# Patient Record
Sex: Male | Born: 1975 | Hispanic: Yes | Marital: Married | Smoking: Never smoker
Health system: Southern US, Community
[De-identification: ages and names within clinical notes are randomized; demographics above are authoritative.]

## PROBLEM LIST (undated history)

## (undated) HISTORY — PX: OTHER SURGICAL HISTORY: SHX169

---

## 2011-01-20 ENCOUNTER — Emergency Department (INDEPENDENT_AMBULATORY_CARE_PROVIDER_SITE_OTHER)
Admission: EM | Admit: 2011-01-20 | Discharge: 2011-01-20 | Disposition: A | Discharge status: Home / Self Care | Payer: Self-pay | Source: Home / Self Care | Attending: Family Medicine | Admitting: Family Medicine

## 2011-01-20 ENCOUNTER — Encounter (HOSPITAL_COMMUNITY): Payer: Self-pay | Admitting: *Deleted

## 2011-01-20 DIAGNOSIS — M545 Low back pain: Secondary | ICD-10-CM

## 2011-01-20 DIAGNOSIS — Z041 Encounter for examination and observation following transport accident: Secondary | ICD-10-CM

## 2011-01-20 MED ORDER — IBUPROFEN 800 MG PO TABS: 800.0000 mg | ORAL_TABLET | Freq: Three times a day (TID) | ORAL | Status: AC

## 2011-01-20 MED ORDER — GUAIFENESIN-CODEINE 100-10 MG/5ML PO SYRP: 10.0000 mL | ORAL_SOLUTION | Freq: Three times a day (TID) | ORAL | Status: DC | PRN

## 2011-01-20 MED ORDER — METHOCARBAMOL 500 MG PO TABS: 500.0000 mg | ORAL_TABLET | Freq: Three times a day (TID) | ORAL | Status: AC

## 2011-01-20 MED ORDER — IPRATROPIUM BROMIDE 0.06 % NA SOLN: 2.0000 | Freq: Four times a day (QID) | NASAL | Status: DC

## 2011-01-20 NOTE — ED Provider Notes (Deleted)
History     CSN: [id number]  Arrival date & time 01/20/11  1545   First MD Initiated Contact with Patient 01/20/11 1617      No chief complaint on file.   (Consider location/radiation/quality/duration/timing/severity/associated sxs/prior treatment) Patient is a 36 y.o. male presenting with URI. The history is provided by the patient.  URI The primary symptoms include cough. Primary symptoms do not include fever, nausea, vomiting, myalgias, arthralgias or rash. The current episode started 3 to 5 days ago. This is a recurrent problem. The problem has been gradually improving.  The onset of the illness is associated with exposure to sick contacts. Symptoms associated with the illness include congestion and rhinorrhea.    No past medical history on file.  No past surgical history on file.  No family history on file.  History  Substance Use Topics  . Smoking status: Not on file  . Smokeless tobacco: Not on file  . Alcohol Use: Not on file      Review of Systems  Constitutional: Negative.  Negative for fever.  HENT: Positive for congestion and rhinorrhea.   Eyes: Positive for redness.  Respiratory: Positive for cough.   Gastrointestinal: Negative.  Negative for nausea and vomiting.  Genitourinary: Negative.   Musculoskeletal: Negative for myalgias and arthralgias.  Skin: Negative for rash.    Allergies  Review of patient's allergies indicates not on file.  Home Medications  No current outpatient prescriptions on file.  There were no vitals taken for this visit.  Physical Exam  Nursing note and vitals reviewed. Constitutional: He is oriented to person, place, and time. He appears well-developed and well-nourished.  HENT:  Head: Normocephalic.  Right Ear: External ear normal.  Left Ear: External ear normal.  Nose: Mucosal edema and rhinorrhea present.  Mouth/Throat: Oropharynx is clear and moist.  Eyes: EOM are normal. Pupils are equal, round, and reactive to  light. Right conjunctiva is injected. Left conjunctiva is injected.  Neck: Normal range of motion. Neck supple.  Cardiovascular: Normal rate, normal heart sounds and intact distal pulses.   Pulmonary/Chest: Effort normal and breath sounds normal. He has no wheezes. He has no rales.  Lymphadenopathy:    He has no cervical adenopathy.  Neurological: He is alert and oriented to person, place, and time.  Skin: Skin is warm and dry.  Psychiatric: He has a normal mood and affect.    ED Course  Procedures (including critical care time)  Labs Reviewed - No data to display No results found.   No diagnosis found.    MDM          Barkley Bruns, MD 01/20/11 [phone]

## 2011-01-20 NOTE — Discharge Instructions (Signed)
Use medicine as needed and heat and stretch back daily.
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
## 2013-12-31 ENCOUNTER — Emergency Department (HOSPITAL_COMMUNITY)
Admission: EM | Admit: 2013-12-31 | Discharge: 2013-12-31 | Disposition: A | Discharge status: Home / Self Care | Payer: 59 | Attending: Emergency Medicine | Admitting: Emergency Medicine

## 2013-12-31 ENCOUNTER — Encounter (HOSPITAL_COMMUNITY): Payer: Self-pay | Admitting: Emergency Medicine

## 2013-12-31 DIAGNOSIS — R42 Dizziness and giddiness: Secondary | ICD-10-CM | POA: Diagnosis not present

## 2013-12-31 DIAGNOSIS — R51 Headache: Secondary | ICD-10-CM | POA: Insufficient documentation

## 2013-12-31 DIAGNOSIS — I10 Essential (primary) hypertension: Secondary | ICD-10-CM | POA: Diagnosis present

## 2013-12-31 LAB — CBC WITH DIFFERENTIAL/PLATELET
BASOS ABS: 0 10*3/uL (ref 0.0–0.1)
BASOS PCT: 0 % (ref 0–1)
Eosinophils Absolute: 0.2 10*3/uL (ref 0.0–0.7)
Eosinophils Relative: 2 % (ref 0–5)
HEMATOCRIT: 44.4 % (ref 39.0–52.0)
Hemoglobin: 14.9 g/dL (ref 13.0–17.0)
LYMPHS PCT: 34 % (ref 12–46)
Lymphs Abs: 2.3 10*3/uL (ref 0.7–4.0)
MCH: 30.6 pg (ref 26.0–34.0)
MCHC: 33.6 g/dL (ref 30.0–36.0)
MCV: 91.2 fL (ref 78.0–100.0)
MONO ABS: 0.6 10*3/uL (ref 0.1–1.0)
Monocytes Relative: 9 % (ref 3–12)
NEUTROS ABS: 3.7 10*3/uL (ref 1.7–7.7)
NEUTROS PCT: 55 % (ref 43–77)
PLATELETS: 274 10*3/uL (ref 150–400)
RBC: 4.87 MIL/uL (ref 4.22–5.81)
RDW: 12.4 % (ref 11.5–15.5)
WBC: 6.9 10*3/uL (ref 4.0–10.5)

## 2013-12-31 LAB — COMPREHENSIVE METABOLIC PANEL
ALBUMIN: 4.3 g/dL (ref 3.5–5.2)
ALT: 26 U/L (ref 0–53)
AST: 25 U/L (ref 0–37)
Alkaline Phosphatase: 66 U/L (ref 39–117)
Anion gap: 12 (ref 5–15)
BILIRUBIN TOTAL: 0.4 mg/dL (ref 0.3–1.2)
BUN: 15 mg/dL (ref 6–23)
CHLORIDE: 104 meq/L (ref 96–112)
CO2: 23 mmol/L (ref 19–32)
Calcium: 9.6 mg/dL (ref 8.4–10.5)
Creatinine, Ser: 0.92 mg/dL (ref 0.50–1.35)
GFR calc Af Amer: >90 mL/min (ref >90)
Glucose, Bld: 93 mg/dL (ref 70–99)
POTASSIUM: 3.9 mmol/L (ref 3.5–5.1)
SODIUM: 139 mmol/L (ref 135–145)
Total Protein: 7 g/dL (ref 6.0–8.3)

## 2013-12-31 MED ORDER — LISINOPRIL 5 MG PO TABS: 20.0000 mg | ORAL_TABLET | Freq: Every day | ORAL | Status: DC

## 2013-12-31 NOTE — ED Notes (Signed)
Pt a/o x 4 on d/c with steady gait. 

## 2013-12-31 NOTE — ED Notes (Signed)
Pt c/o htn and some HA and dizziness x 1 week

## 2013-12-31 NOTE — ED Provider Notes (Signed)
CSN: [id number]     Arrival date & time 12/31/13  1621 History   First MD Initiated Contact with Patient 12/31/13 1939     Chief Complaint  Patient presents with  . Hypertension     (Consider location/radiation/quality/duration/timing/severity/associated sxs/prior Treatment) HPI Comments: Patient presents to the ER for evaluation of headache, mild dizziness. Patient reports symptoms have been ongoing for a week. He went to [facility] today and checked his blood pressure which was elevated. He does not have a history of hypertension. He has not had any chest pain or shortness of breath. There is no blurred vision. He does report that he has been under a lot of stress at home.  Patient is a 38 y.o. male presenting with hypertension.  Hypertension Associated symptoms include headaches.    History reviewed. No pertinent past medical history. History reviewed. No pertinent past surgical history. History reviewed. No pertinent family history. History  Substance Use Topics  . Smoking status: Never Smoker   . Smokeless tobacco: Not on file  . Alcohol Use: Yes    Review of Systems  Neurological: Positive for dizziness and headaches.  All other systems reviewed and are negative.     Allergies  Review of patient's allergies indicates no known allergies.  Home Medications   Prior to Admission medications   Medication Sig Start Date End Date Taking? Authorizing Provider  ibuprofen (ADVIL,MOTRIN) 400 MG tablet Take 400 mg by mouth every 6 (six) hours as needed for mild pain.    Yes Historical Provider, MD   BP 148/84 mmHg  Pulse 73  Temp(Src) 97.9 F (36.6 C) (Oral)  Resp 19  SpO2 100% Physical Exam  Constitutional: He is oriented to person, place, and time. He appears well-developed and well-nourished. No distress.  HENT:  Head: Normocephalic and atraumatic.  Right Ear: Hearing normal.  Left Ear: Hearing normal.  Nose: Nose normal.  Mouth/Throat: Oropharynx is clear and  moist and mucous membranes are normal.  Eyes: Conjunctivae and EOM are normal. Pupils are equal, round, and reactive to light.  Neck: Normal range of motion. Neck supple.  Cardiovascular: Regular rhythm, S1 normal and S2 normal.  Exam reveals no gallop and no friction rub.   No murmur heard. Pulmonary/Chest: Effort normal and breath sounds normal. No respiratory distress. He exhibits no tenderness.  Abdominal: Soft. Normal appearance and bowel sounds are normal. There is no hepatosplenomegaly. There is no tenderness. There is no rebound, no guarding, no tenderness at McBurney's point and negative Murphy's sign. No hernia.  Musculoskeletal: Normal range of motion.  Neurological: He is alert and oriented to person, place, and time. He has normal strength. No cranial nerve deficit or sensory deficit. Coordination normal. GCS eye subscore is 4. GCS verbal subscore is 5. GCS motor subscore is 6.  Extraocular muscle movement: normal No visual field cut Pupils: equal and reactive both direct and consensual response is normal No nystagmus present    Sensory function is intact to light touch, pinprick Proprioception intact  Grip strength 5/5 symmetric in upper extremities Lower extremity strength 5/5 against gravity No pronator drift Normal finger to nose bilaterally Normal heel to shin bilaterally  Gait: normal    Skin: Skin is warm, dry and intact. No rash noted. No cyanosis.  Psychiatric: He has a normal mood and affect. His speech is normal and behavior is normal. Thought content normal.  Nursing note and vitals reviewed.   ED Course  Procedures (including critical care time) Labs Review Labs Reviewed  CBC WITH DIFFERENTIAL  COMPREHENSIVE METABOLIC PANEL    Imaging Review No results found.   EKG Interpretation None      MDM   Final diagnoses:  None  Hypertention  Patient presents early for evaluation of elevated blood pressure. He checked his blood pressure prior to  coming in was elevated. He does not have a previous history. Patient has been experiencing mild headaches and dizziness this week. He does not, however, have any focal findings on his neurologic exam. Headache is not concerning for subarachnoid hemorrhage. He is afebrile, no signs of meningismus.  Patient does have mildly elevated blood pressure. The ER. I did have a conversation with he and his significant other. He does not have a primary doctor and does not like to see doctors. Significant other is concerned that he might not follow up, and therefore patient will be initiated on lisinopril. He was given resources for primary care. Return if symptoms worsen.  Orpah Greek, MD 12/31/13 2011

## 2013-12-31 NOTE — Discharge Instructions (Signed)
Hypertension °Hypertension, commonly called high blood pressure, is when the force of blood pumping through your arteries is too strong. Your arteries are the blood vessels that carry blood from your heart throughout your body. A blood pressure reading consists of a higher number over a lower number, such as 110/72. The higher number (systolic) is the pressure inside your arteries when your heart pumps. The lower number (diastolic) is the pressure inside your arteries when your heart relaxes. Ideally you want your blood pressure below 120/80. °Hypertension forces your heart to work harder to pump blood. Your arteries may become narrow or stiff. Having hypertension puts you at risk for heart disease, stroke, and other problems.  °RISK FACTORS °Some risk factors for high blood pressure are controllable. Others are not.  °Risk factors you cannot control include:  °· Race. You may be at higher risk if you are African American. °· Age. Risk increases with age. °· Gender. Men are at higher risk than women before age 45 years. After age 65, women are at higher risk than men. °Risk factors you can control include: °· Not getting enough exercise or physical activity. °· Being overweight. °· Getting too much fat, sugar, calories, or salt in your diet. °· Drinking too much alcohol. °SIGNS AND SYMPTOMS °Hypertension does not usually cause signs or symptoms. Extremely high blood pressure (hypertensive crisis) may cause headache, anxiety, shortness of breath, and nosebleed. °DIAGNOSIS  °To check if you have hypertension, your health care provider will measure your blood pressure while you are seated, with your arm held at the level of your heart. It should be measured at least twice using the same arm. Certain conditions can cause a difference in blood pressure between your right and left arms. A blood pressure reading that is higher than normal on one occasion does not mean that you need treatment. If one blood pressure reading  is high, ask your health care provider about having it checked again. °TREATMENT  °Treating high blood pressure includes making lifestyle changes and possibly taking medicine. Living a healthy lifestyle can help lower high blood pressure. You may need to change some of your habits. °Lifestyle changes may include: °· Following the DASH diet. This diet is high in fruits, vegetables, and whole grains. It is low in salt, red meat, and added sugars. °· Getting at least 2½ hours of brisk physical activity every week. °· Losing weight if necessary. °· Not smoking. °· Limiting alcoholic beverages. °· Learning ways to reduce stress. ° If lifestyle changes are not enough to get your blood pressure under control, your health care provider may prescribe medicine. You may need to take more than one. Work closely with your health care provider to understand the risks and benefits. °HOME CARE INSTRUCTIONS °· Have your blood pressure rechecked as directed by your health care provider.   °· Take medicines only as directed by your health care provider. Follow the directions carefully. Blood pressure medicines must be taken as prescribed. The medicine does not work as well when you skip doses. Skipping doses also puts you at risk for problems.   °· Do not smoke.   °· Monitor your blood pressure at home as directed by your health care provider.  °SEEK MEDICAL CARE IF:  °· You think you are having a reaction to medicines taken. °· You have recurrent headaches or feel dizzy. °· You have swelling in your ankles. °· You have trouble with your vision. °SEEK IMMEDIATE MEDICAL CARE IF: °· You develop a severe headache or confusion. °·   You have unusual weakness, numbness, or feel faint. °· You have severe chest or abdominal pain. °· You vomit repeatedly. °· You have trouble breathing. °MAKE SURE YOU:  °· Understand these instructions. °· Will watch your condition. °· Will get help right away if you are not doing well or get worse. °Document  Released: 12/21/2004 Document Revised: 05/07/2013 Document Reviewed: 10/13/2012 °ExitCare® Patient Information ©2015 [employer]. This information is not intended to replace advice given to you by your health care provider. Make sure you discuss any questions you have with your health care provider. ° °DASH Eating Plan °DASH stands for "Dietary Approaches to Stop Hypertension." The DASH eating plan is a healthy eating plan that has been shown to reduce high blood pressure (hypertension). Additional health benefits may include reducing the risk of type 2 diabetes mellitus, heart disease, and stroke. The DASH eating plan may also help with weight loss. °WHAT DO I NEED TO KNOW ABOUT THE DASH EATING PLAN? °For the DASH eating plan, you will follow these general guidelines: °· Choose foods with a percent daily value for sodium of less than 5% (as listed on the food label). °· Use salt-free seasonings or herbs instead of table salt or sea salt. °· Check with your health care provider or pharmacist before using salt substitutes. °· Eat lower-sodium products, often labeled as "lower sodium" or "no salt added." °· Eat fresh foods. °· Eat more vegetables, fruits, and low-fat dairy products. °· Choose whole grains. Look for the word "whole" as the first word in the ingredient list. °· Choose fish and skinless chicken or turkey more often than red meat. Limit fish, poultry, and meat to 6 oz (170 g) each day. °· Limit sweets, desserts, sugars, and sugary drinks. °· Choose heart-healthy fats. °· Limit cheese to 1 oz (28 g) per day. °· Eat more home-cooked food and less restaurant, buffet, and fast food. °· Limit fried foods. °· Cook foods using methods other than frying. °· Limit canned vegetables. If you do use them, rinse them well to decrease the sodium. °· When eating at a restaurant, ask that your food be prepared with less salt, or no salt if possible. °WHAT FOODS CAN I EAT? °Seek help from a dietitian for individual  calorie needs. °Grains °Whole grain or whole wheat bread. Brown rice. Whole grain or whole wheat pasta. Quinoa, bulgur, and whole grain cereals. Low-sodium cereals. Corn or whole wheat flour tortillas. Whole grain cornbread. Whole grain crackers. Low-sodium crackers. °Vegetables °Fresh or frozen vegetables (raw, steamed, roasted, or grilled). Low-sodium or reduced-sodium tomato and vegetable juices. Low-sodium or reduced-sodium tomato sauce and paste. Low-sodium or reduced-sodium canned vegetables.  °Fruits °All fresh, canned (in natural juice), or frozen fruits. °Meat and Other Protein Products °Ground beef (85% or leaner), grass-fed beef, or beef trimmed of fat. Skinless chicken or turkey. Ground chicken or turkey. Pork trimmed of fat. All fish and seafood. Eggs. Dried beans, peas, or lentils. Unsalted nuts and seeds. Unsalted canned beans. °Dairy °Low-fat dairy products, such as skim or 1% milk, 2% or reduced-fat cheeses, low-fat ricotta or cottage cheese, or plain low-fat yogurt. Low-sodium or reduced-sodium cheeses. °Fats and Oils °Tub margarines without trans fats. Light or reduced-fat mayonnaise and salad dressings (reduced sodium). Avocado. Safflower, olive, or canola oils. Natural peanut or almond butter. °Other °Unsalted popcorn and pretzels. °The items listed above may not be a complete list of recommended foods or beverages. Contact your dietitian for more options. °WHAT FOODS ARE NOT RECOMMENDED? °Grains °White   bread. White pasta. White rice. Refined cornbread. Bagels and croissants. Crackers that contain trans fat. Vegetables Creamed or fried vegetables. Vegetables in a cheese sauce. Regular canned vegetables. Regular canned tomato sauce and paste. Regular tomato and vegetable juices. Fruits Dried fruits. Canned fruit in light or heavy syrup. Fruit juice. Meat and Other Protein Products Fatty cuts of meat. Ribs, chicken wings, bacon, sausage, bologna, salami, chitterlings, fatback, hot dogs,  bratwurst, and packaged luncheon meats. Salted nuts and seeds. Canned beans with salt. Dairy Whole or 2% milk, cream, half-and-half, and cream cheese. Whole-fat or sweetened yogurt. Full-fat cheeses or blue cheese. Nondairy creamers and whipped toppings. Processed cheese, cheese spreads, or cheese curds. Condiments Onion and garlic salt, seasoned salt, table salt, and sea salt. Canned and packaged gravies. Worcestershire sauce. Tartar sauce. Barbecue sauce. Teriyaki sauce. Soy sauce, including reduced sodium. Steak sauce. Fish sauce. Oyster sauce. Cocktail sauce. Horseradish. Ketchup and mustard. Meat flavorings and tenderizers. Bouillon cubes. Hot sauce. Tabasco sauce. Marinades. Taco seasonings. Relishes. Fats and Oils Butter, stick margarine, lard, shortening, ghee, and bacon fat. Coconut, palm kernel, or palm oils. Regular salad dressings. Other Pickles and olives. Salted popcorn and pretzels. The items listed above may not be a complete list of foods and beverages to avoid. Contact your dietitian for more information. WHERE CAN I FIND MORE INFORMATION? National Heart, Lung, and Blood Institute: travelstabloid.com Document Released: 12/10/2010 Document Revised: 05/07/2013 Document Reviewed: 10/25/2012 [facility] Patient Information 2015 [place], [place]. This information is not intended to replace advice given to you by your health care provider. Make sure you discuss any questions you have with your health care provider.   Emergency Department Resource Guide 1) Find a Doctor and Pay Out of Pocket Although you won't have to find out who is covered by your insurance plan, it is a good idea to ask around and get recommendations. You will then need to call the office and see if the doctor you have chosen will accept you as a new patient and what types of options they offer for patients who are self-pay. Some doctors offer discounts or will set up payment plans for  their patients who do not have insurance, but you will need to ask so you aren't surprised when you get to your appointment.  2) Contact Your Local Health Department Not all health departments have doctors that can see patients for sick visits, but many do, so it is worth a call to see if yours does. If you don't know where your local health department is, you can check in your phone book. The CDC also has a tool to help you locate your state's health department, and many state websites also have listings of all of their local health departments.  3) Find a [facility] If your illness is not likely to be very severe or complicated, you may want to try a walk in clinic. These are popping up all over the country in pharmacies, drugstores, and shopping centers. They're usually staffed by nurse practitioners or physician assistants that have been trained to treat common illnesses and complaints. They're usually fairly quick and inexpensive. However, if you have serious medical issues or chronic medical problems, these are probably not your best option.  No Primary Care Doctor: - Call Health Connect at  [phone] - they can help you locate a primary care doctor that  accepts your insurance, provides certain services, etc. - Physician Referral Service- [phone]  Chronic Pain Problems: Organization  Address  Phone   Notes  [facility]  [phone] Patients need to be referred by their primary care doctor.   Medication Assistance: Organization         Address  Phone   Notes  [facility] [address] [place], [place] [address] [phone] --Must be a resident of [facility] -- Must have NO insurance coverage whatsoever (no Medicaid/ Medicare, etc.) -- The pt. MUST have a primary care doctor that directs their care regularly and follows them in the community   [facility]ist  [phone]   [employer]  [phone]    Agencies that provide inexpensive medical care: Organization         Address  Phone   Notes  [place]  [phone]   [facility]    [phone]   [facility] [place],  [address] [phone]   [place] [address] [place] [phone]   Planned Parenthood    [phone]   [facility]    [phone]   [place] and [place] [address], [place] Phone:  [phone], Fax:  [phone] Hours of Operation:  9 am - 6 pm, M-F.  Also accepts Medicaid/Medicare and self-pay.  [facility] for [place], [address], [place] Phone: [phone], Fax: [phone]. Hours of Operation:  8:30 am - 5:30 pm, M-F.  Also accepts Medicaid and self-pay.  [facility] [place] [address], [place] Phone: [phone]   [place], [place], [place] [phone], Ext. [phone] Mondays & Thursdays: 7-9 AM.  First 15 patients are seen on a first come, first serve basis.    [place] Providers:  Organization         Address  Phone   Notes  [facility] [address], [place] [phone] Also accepts self-pay patients.  [facility] [address], [place]  [phone]   [place], [address], [place] [phone]   [facility] [address], [place] [phone]   Lucianne Lei [address], [place]   [phone] Only accepts [place] Access [place] patients after they have their name applied to their card.   Self-Pay (no insurance) in [facility]:  Organization         Address  Phone   Notes  Sickle Cell Patients, [facility] [place] [phone]   [facility] [place] [phone]   [facility] [place]  [place], [place], [place] [phone]   [facility]/Dr. Osei-Bonsu  [address] [place] or [address], [place] [phone] Phone number for both [place] and [place] locations is the same.  Urgent [facility] and [facility] [address], [place] [phone]   [facility] [address] [place] or [address] [phone]   [facility] [address] [place] [phone], phone; [phone], fax Sees patients 1st and 3rd Saturday of every month.  Must not qualify  for public or private insurance (i.e. Medicaid, Medicare, [place] Health Choice, Veterans' Benefits)  Household income should be no more than 200% of the poverty level The clinic cannot treat you if you are pregnant or think you are pregnant  Sexually transmitted diseases are not treated at the clinic.    Dental Care: Organization         Address  Phone  Notes  [facility] [place] [phone] Accepts children up to age 32 who are enrolled in [place] or [place]; pregnant women with a Medicaid card; and children who have applied for Medicaid or [place] Health Choice, but were declined, whose parents can pay a reduced fee at time of service.  [facility]  [address], [place] [phone] Accepts children up to age 57 who are enrolled in [place] or [place]; pregnant women with a Medicaid card; and children who have applied for Medicaid or  Health Choice, but were declined, whose parents can pay a reduced fee at time of service.  [facility] Adult [facility] Access PROGRAM  [place] [phone] Patients are  seen by appointment only. Walk-ins are not accepted. [place] will see patients 60 years of age and older. Monday - Tuesday (8am-5pm) Most Wednesdays (8:30-5pm) $30 per visit, cash only  [facility] Adult Dental Access PROGRAM  [address], [facility] [phone] Patients are seen by appointment only. Walk-ins are not accepted. [place] will see patients 38 years of age and older. One Wednesday Evening (Monthly: Volunteer Based).  $30 per visit, cash only  [place]  [phone] for adults; Children under age 7, call Graduate Pediatric Dentistry at [phone]. Children aged 67-14, please call [phone] to request a pediatric application.  Dental services are provided in all areas of dental care including fillings, crowns and bridges, complete and partial dentures, implants, gum treatment, root canals, and extractions. Preventive care is also provided. Treatment is provided to both adults and children. Patients are selected via a lottery and there is often a waiting list.   [facility] [address], [place]  [phone] [email or web address]   [facility] [address] [place], [place] [phone], Ext. [phone] Second and Fourth Thursday of each month, opens at 6:30 AM; Clinic ends at 9 AM.  Patients are seen on a first-come first-served basis, and a limited number are seen during each clinic.   [facility]  [address] Hillard Danker [place], [place] [phone]   Eligibility Requirements You must have lived in [place], [place], or [place] counties for at least the last three months.   You cannot be eligible for state or federal sponsored [employer], including [employer], [place], or [employer].   You generally cannot be eligible for healthcare insurance through your employer.    How to apply: Eligibility screenings are held every Tuesday and Wednesday afternoon from 1:00 pm until 4:00  pm. You do not need an appointment for the interview!  [facility] [address] [place], [place]   [place]  [place] Department  [place]  [phone]    Behavioral Health Resources in the Community: Intensive Outpatient Programs Organization         Address  Phone  Notes  [facility] [address], [place], [place] [phone]   [facility] Outpatient [address], [place], [place]   ADS: [facility] [address] [place], [place]   [place] [address]  [place], [place] or [phone]   Substance Abuse Resources Organization         Address  Phone  Notes  Alcohol and Drug Services  [phone]   [place]  [phone]   [facility] [place]   Chinita Pester  [phone]   Residential & Outpatient Substance Abuse Program  [phone]   Psychological Services Organization         Address  Phone  Notes  [facility] [place]  [place]  [phone]   [place] [address], [place] or [phone]    Mobile Crisis Teams Organization         Address  Phone  Notes  [facility], Mobile Crisis Care Unit  [phone]   [facility]  [address] [place], [place]   [place] [address] [place] [phone]    Self-Help/Support Groups Organization         Address  Phone             Notes  [place]. of [place] - variety of support groups  [place] Call for more information  Narcotics Anonymous (NA), [facility] [address], [employer]  2 meetings at this location   Special educational needs teacher          Address  Phone  Notes  [facility] [place],    [place]  [phone]   [facility]  [address] [place] [phone], [place], [place]   [place], [place] [phone] Admissions: 8am-3pm M-F  Incentives Substance [place] [address],    [place], [place] [phone]   [facility] [address] [place], [place], [place]   [facility] [address],  [place], [place]   [facility] Programs - Intensive Outpatient [address], [place], [place]   [facility] ([place].,  [place], [place] [phone] or [phone]   [facility] Services (RTS) [address], [place], [place] Accepts Medicaid  [facility] [place] [address]  [place] [phone] Substance Abuse/Addiction Treatment   [facility] Organization         Address  Phone  Notes  [facility]  [phone]   Domenic Schwab, PhD [address] Arlis [place], [place]   [phone] or [phone]   [place]   [address] [place], [place] [phone]   [facility] [address] [place], [place] [phone] Insurance/Medicaid/sponsorship through [facility] and Families [address], [place]                                    [place], [place] [phone] [place] [address], [place] [phone]    Dr. Adele Schilder  [phone]   [facility] [place]  [facility] 1) [address], [place] 2) [place] 3)  [place] [address], [place] [phone]  [phone]   [place] [phone] or [phone] (After Hours)

## 2014-12-02 ENCOUNTER — Emergency Department (HOSPITAL_COMMUNITY)
Admission: EM | Admit: 2014-12-02 | Discharge: 2014-12-02 | Disposition: A | Discharge status: Home / Self Care | Payer: BLUE CROSS/BLUE SHIELD | Source: Home / Self Care

## 2014-12-02 ENCOUNTER — Emergency Department (INDEPENDENT_AMBULATORY_CARE_PROVIDER_SITE_OTHER): Payer: BLUE CROSS/BLUE SHIELD

## 2014-12-02 ENCOUNTER — Encounter (HOSPITAL_COMMUNITY): Payer: Self-pay | Admitting: Emergency Medicine

## 2014-12-02 DIAGNOSIS — R52 Pain, unspecified: Secondary | ICD-10-CM

## 2014-12-02 DIAGNOSIS — M25511 Pain in right shoulder: Secondary | ICD-10-CM

## 2014-12-02 NOTE — ED Provider Notes (Signed)
CSN: [id number]     Arrival date & time 12/02/14  1302 History   None    Chief Complaint  Patient presents with  . Shoulder Pain   (Consider location/radiation/quality/duration/timing/severity/associated sxs/prior Treatment) HPI  No past medical history on file. No past surgical history on file. No family history on file. Social History  Substance Use Topics  . Smoking status: Never Smoker   . Smokeless tobacco: Not on file  . Alcohol Use: Yes    Review of Systems ROS +'ve right shoulder, upper arm pain  Denies: HEADACHE, NAUSEA, ABDOMINAL PAIN, CHEST PAIN, CONGESTION, DYSURIA, SHORTNESS OF BREATH  Allergies  Review of patient's allergies indicates no known allergies.  Home Medications   Prior to Admission medications   Medication Sig Start Date End Date Taking? Authorizing Provider  ibuprofen (ADVIL,MOTRIN) 400 MG tablet Take 400 mg by mouth every 6 (six) hours as needed for mild pain.     Historical Provider, MD  lisinopril (PRINIVIL,ZESTRIL) 5 MG tablet Take 4 tablets (20 mg total) by mouth daily. 12/31/13   Orpah Greek, MD   Meds Ordered and Administered this Visit  Medications - No data to display  BP 141/95 mmHg  Pulse 77  Temp(Src) 98.5 F (36.9 C) (Oral)  Resp 16  SpO2 100% No data found.   Physical Exam  Constitutional: He appears well-developed and well-nourished.  Musculoskeletal:       Arms:   ED Course  Procedures (including critical care time)  Labs Review Labs Reviewed - No data to display  Imaging Review Dg Shoulder Right  12/02/2014  CLINICAL DATA:  Right shoulder pain 3 days EXAM: RIGHT SHOULDER - 2+ VIEW COMPARISON:  None. FINDINGS: Glenohumeral joint normal. AC joint normal. Negative for fracture. There is significant cortical thickening of the proximal mid humerus diffusely. No acute periosteal reaction. No acute bony destruction. Surrounding soft tissues intact. IMPRESSION: Negative for fracture or dislocation Diffuse  cortical thickening of the proximal mid humerus. This may represent Paget's disease. Chronic infection and neoplasm not considered likely. Further evaluation with right humerus x-ray suggested. Correlate with clinical symptoms. MRI of the right humerus without with contrast may be useful. Electronically Signed   By: Franchot Gallo M.D.   On: 12/02/2014 14:24     Visual Acuity Review  Right Eye Distance:   Left Eye Distance:   Bilateral Distance:    Right Eye Near:   Left Eye Near:    Bilateral Near:         MDM   1. Shoulder pain, right   2. Pain    I have explained to patient and his male partner that I do not have a good explanation for the chronic pain that he is having in his right upper arm. I have offered several remedies but all have been declined as they have used NSAIDS, heat etc without relief. I have suggested follow with a specialist if they so desire and the on call ortho is provided. Pt partner felt this visit was a waste of time. Sling offered for comfort and declined.  Discussed xray results also.     Konrad Felix, PA 12/03/14 0830

## 2014-12-02 NOTE — ED Notes (Signed)
Pt here with right shoulder intermit pain, non radiating that started last Friday Denies injury or strain. 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

## 2014-12-02 NOTE — Discharge Instructions (Signed)
Cryotherapy Cryotherapy is when you put ice on your injury. Ice helps lessen pain and puffiness (swelling) after an injury. Ice works the best when you start using it in the first 24 to 48 hours after an injury. HOME CARE  Put a dry or damp towel between the ice pack and your skin.  You may press gently on the ice pack.  Leave the ice on for no more than 10 to 20 minutes at a time.  Check your skin after 5 minutes to make sure your skin is okay.  Rest at least 20 minutes between ice pack uses.  Stop using ice when your skin loses feeling (numbness).  Do not use ice on someone who cannot tell you when it hurts. This includes small children and people with memory problems (dementia). GET HELP RIGHT AWAY IF:  You have white spots on your skin.  Your skin turns blue or pale.  Your skin feels waxy or hard.  Your puffiness gets worse. MAKE SURE YOU:   Understand these instructions.  Will watch your condition.  Will get help right away if you are not doing well or get worse.   This information is not intended to replace advice given to you by your health care provider. Make sure you discuss any questions you have with your health care provider.   Document Released: 06/09/2007 Document Revised: 03/15/2011 Document Reviewed: 08/13/2010 Elsevier Interactive Patient Education 2016 Needles con calor (Heat Therapy) La terapia con calor puede ayudar a aliviar articulaciones y msculos doloridos, lesionados, tensos y rgidos. El calor [employer], lo cual puede ayudar a Best boy. La terapia con calor solo se debe usar en lesiones viejas, preexistentes o de larga duracin (crnicas). No use la terapia con calor a menos que se lo haya indicado el mdico. CMO USAR LA TERAPIA CON CALOR Existen varios tipos distintos de terapia con calor, como:  Compresas hmedas calientes.  Bao de agua caliente.  Bolsa de agua caliente.  [place] trmica.  Bolsa de  gel caliente.  Vendaje caliente.  [place] trmica. RECOMENDACIONES GENERALES PARA LA TERAPIA CON CALOR   No duerma mientras [place] la terapia con calor. Utilice la terapia con calor solo mientras est despierto.  La piel puede volverse rosada mientras [place] la terapia con calor. No use la terapia con calor si la piel se pone roja.  No use la terapia con calor si siente un dolor nuevo.  Una temperatura muy alta o una exposicin prolongada al calor puede causar quemaduras. Sea cauto con la terapia de calor para evitar quemar la piel.  No use la terapia con calor en zonas de la piel que ya estn irritadas, como con una erupcin o una quemadura de sol. SOLICITE AYUDA SI:   Observa ampollas, enrojecimiento, hinchazn o adormecimiento.  Siente un dolor nuevo.  El dolor [place]. ASEGRESE DE QUE:  Comprende estas instrucciones.  Controlar su afeccin.  Recibir ayuda de inmediato si no mejora o si empeora.   Esta informacin no tiene Marine scientist el consejo del mdico. Asegrese de hacerle al mdico cualquier pregunta que tenga.   Document Released: 03/15/2011 Document Revised: 01/11/2014 Elsevier Interactive Patient Education [employer].

## 2014-12-09 ENCOUNTER — Emergency Department (HOSPITAL_COMMUNITY)
Admission: EM | Admit: 2014-12-09 | Discharge: 2014-12-09 | Disposition: A | Discharge status: Home / Self Care | Payer: BLUE CROSS/BLUE SHIELD | Attending: Emergency Medicine | Admitting: Emergency Medicine

## 2014-12-09 ENCOUNTER — Encounter (HOSPITAL_COMMUNITY): Payer: Self-pay | Admitting: Emergency Medicine

## 2014-12-09 DIAGNOSIS — M25511 Pain in right shoulder: Secondary | ICD-10-CM | POA: Diagnosis present

## 2014-12-09 MED ORDER — HYDROCODONE-ACETAMINOPHEN 5-325 MG PO TABS: 1.0000 | ORAL_TABLET | Freq: Four times a day (QID) | ORAL | Status: DC | PRN

## 2014-12-09 MED ORDER — IBUPROFEN 800 MG PO TABS: 800.0000 mg | ORAL_TABLET | Freq: Three times a day (TID) | ORAL | Status: DC

## 2014-12-09 MED ORDER — KETOROLAC TROMETHAMINE 30 MG/ML IJ SOLN
60.0000 mg | Freq: Once | INTRAMUSCULAR | Status: AC
Start: 2014-12-09 — End: 2014-12-09
  Administered 2014-12-09: 60 mg via INTRAMUSCULAR
  Filled 2014-12-09: qty 2

## 2014-12-09 NOTE — ED Provider Notes (Signed)
CSN: [id number]     Arrival date & time 12/09/14  2139 History   First MD Initiated Contact with Patient 12/09/14 2213     Chief Complaint  Patient presents with  . Arm Pain   The history is provided by the patient. No language interpreter was used.    HPI Comments: Carl Odom is a 39 y.o. male with no PMHx who presents to the Emergency Department complaining of constant, moderate, right shoulder pain onset 2 weeks that has begun to gradually worsen over the past two days. He reports that the pain radiates from his right shoulder down to his right elbow. Pt notes he was seen at [facility] 1 week ago and was referred to [facility]. Pt states he was seen by [facility], 2 days ago and they scheduled for him to have an MRI on 12/18/14. He denies any known injury or trauma. He endorses the pain is exacerbated with movement of his right shoulder. He has tried Tylenol, Motrin and warm soaks with no significant relief. 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 He denies any prior issues with the shoulder. Pt denies any swelling, redness, warmth, or other associated symptoms at this time.  History reviewed. No pertinent past medical history. History reviewed. No pertinent past surgical history. No family history on file. Social History  Substance Use Topics  . Smoking status: Never Smoker   . Smokeless tobacco: None  . Alcohol Use: Yes    Review of Systems  All other systems reviewed and are negative.     Allergies  Review of patient's allergies indicates no known allergies.  Home Medications   Prior to Admission medications   Medication Sig Start Date End Date Taking? Authorizing Provider  ibuprofen (ADVIL,MOTRIN) 400 MG tablet Take 400 mg by mouth every 6 (six) hours as needed for mild pain.     Historical Provider, MD  lisinopril (PRINIVIL,ZESTRIL) 5 MG tablet Take 4 tablets (20 mg total) by mouth daily. 12/31/13   Orpah Greek, MD   Triage Vitals: BP 146/75 mmHg  Pulse 73  Temp(Src) 98.3 F (36.8 C) (Oral)  Resp 16  Ht 5\' 3"  (1.6 m)  Wt 170 lb (77.111 kg)  BMI 30.12 kg/m2  SpO2 100%  Physical Exam  Constitutional: He is oriented to person, place, and time. He appears well-developed and well-nourished. No distress.  HENT:  Head: Normocephalic and atraumatic.  Eyes: Conjunctivae and EOM are normal.  Neck: Neck supple. No tracheal deviation present.  Cardiovascular: Normal rate.   Pulmonary/Chest: Effort normal. No respiratory distress.  Musculoskeletal: Normal range of motion.       Right upper arm: He exhibits tenderness. He exhibits no swelling.  Tenderness from the right shoulder to the right elbow. No swelling, warmth or redness Decreased ROM posteriorly and unable to raise arm above shoulder level  Neurological: He is alert and oriented to person, place, and time.  Skin: Skin is warm and dry.  Psychiatric: He has a normal mood and affect. His behavior is normal.  Nursing note and vitals reviewed.   ED Course  Procedures (including critical care time)  DIAGNOSTIC STUDIES: Oxygen Saturation is 100% on RA, normal by my interpretation.    COORDINATION OF CARE:  10:21 PM- Will give pt Toradol injection while in the ER. Will d/c pt home with short course of Vicodin and rx for Ibuprofen 800mg  TID. Advised pt that ultimately he will  need to have MRI in order to diagnose. Pt advised of plan for treatment and pt agrees.   Labs Review Labs Reviewed - No data to display  Imaging Review No results found. I have personally reviewed and evaluated these images and lab results as part of my medical decision-making.   EKG Interpretation None      MDM   Final diagnoses:  Right shoulder pain    Pt to have mri in a week. Neurovascularly intact. Given toradol here and hydrocodone for home  I personally performed the services described in this documentation, which was scribed in my  presence. The recorded information has been reviewed and is accurate.   Glendell Docker, NP 12/09/14 2226  Julianne Rice, MD 12/09/14 2237

## 2014-12-09 NOTE — Discharge Instructions (Signed)
Follow up for the mri as planned Joint Pain Joint pain, which is also called arthralgia, can be caused by many things. Joint pain often goes away when you follow your health care provider's instructions for relieving pain at home. However, joint pain can also be caused by conditions that require further treatment. Common causes of joint pain include:  Bruising in the area of the joint.  Overuse of the joint.  Wear and tear on the joints that occur with aging (osteoarthritis).  Various other forms of arthritis.  A buildup of a crystal form of uric acid in the joint (gout).  Infections of the joint (septic arthritis) or of the bone (osteomyelitis). Your health care provider may recommend medicine to help with the pain. If your joint pain continues, additional tests may be needed to diagnose your condition. HOME CARE INSTRUCTIONS Watch your condition for any changes. Follow these instructions as directed to lessen the pain that you are feeling.  Take medicines only as directed by your health care provider.  Rest the affected area for as long as your health care provider says that you should. If directed to do so, raise the painful joint above the level of your heart while you are sitting or lying down.  Do not do things that cause or worsen pain.  If directed, apply ice to the painful area:  Put ice in a plastic bag.  Place a towel between your skin and the bag.  Leave the ice on for 20 minutes, 2-3 times per day.  Wear an elastic bandage, splint, or sling as directed by your health care provider. Loosen the elastic bandage or splint if your fingers or toes become numb and tingle, or if they turn cold and blue.  Begin exercising or stretching the affected area as directed by your health care provider. Ask your health care provider what types of exercise are safe for you.  Keep all follow-up visits as directed by your health care provider. This is important. SEEK MEDICAL CARE  IF:  Your pain increases, and medicine does not help.  Your joint pain does not improve within 3 days.  You have increased bruising or swelling.  You have a fever.  You lose 10 lb (4.5 kg) or more without trying. SEEK IMMEDIATE MEDICAL CARE IF:  You are not able to move the joint.  Your fingers or toes become numb or they turn cold and blue.   This information is not intended to replace advice given to you by your health care provider. Make sure you discuss any questions you have with your health care provider.   Document Released: 12/21/2004 Document Revised: 01/11/2014 Document Reviewed: 10/02/2013 Elsevier Interactive Patient Education [employer].

## 2014-12-09 NOTE — ED Notes (Signed)
Pt. reports persistent right upper arm pain radiating to forearm for 2 weeks , denies injury , seen at urgent care and [facility] orthopedic last week scheduled for MRI , pain worse this evening .

## 2014-12-11 ENCOUNTER — Other Ambulatory Visit: Payer: Self-pay | Admitting: Orthopedic Surgery

## 2014-12-11 DIAGNOSIS — M25511 Pain in right shoulder: Secondary | ICD-10-CM

## 2014-12-18 ENCOUNTER — Ambulatory Visit
Admission: RE | Admit: 2014-12-18 | Discharge: 2014-12-18 | Disposition: A | Discharge status: Home / Self Care | Payer: BLUE CROSS/BLUE SHIELD | Source: Ambulatory Visit | Attending: Orthopedic Surgery | Admitting: Orthopedic Surgery

## 2014-12-18 DIAGNOSIS — M25511 Pain in right shoulder: Secondary | ICD-10-CM

## 2015-02-09 ENCOUNTER — Other Ambulatory Visit (HOSPITAL_COMMUNITY)
Admit: 2015-02-09 | Discharge: 2015-02-09 | Disposition: A | Discharge status: Home / Self Care | Payer: BLUE CROSS/BLUE SHIELD | Source: Ambulatory Visit | Attending: Infectious Diseases | Admitting: Infectious Diseases

## 2015-02-09 DIAGNOSIS — M868X5 Other osteomyelitis, thigh: Secondary | ICD-10-CM | POA: Diagnosis not present

## 2015-02-09 LAB — BUN: BUN: 10 mg/dL (ref 6–20)

## 2015-02-09 LAB — CBC WITH DIFFERENTIAL/PLATELET
BASOS ABS: 0.1 10*3/uL (ref 0.0–0.1)
Basophils Relative: 1 %
EOS PCT: 4 %
Eosinophils Absolute: 0.2 10*3/uL (ref 0.0–0.7)
HEMATOCRIT: 37.3 % — AB (ref 39.0–52.0)
Hemoglobin: 12.3 g/dL — ABNORMAL LOW (ref 13.0–17.0)
LYMPHS ABS: 2.1 10*3/uL (ref 0.7–4.0)
LYMPHS PCT: 38 %
MCH: 29.8 pg (ref 26.0–34.0)
MCHC: 33 g/dL (ref 30.0–36.0)
MCV: 90.3 fL (ref 78.0–100.0)
Monocytes Absolute: 0.5 10*3/uL (ref 0.1–1.0)
Monocytes Relative: 9 %
NEUTROS ABS: 2.6 10*3/uL (ref 1.7–7.7)
Neutrophils Relative %: 48 %
PLATELETS: 282 10*3/uL (ref 150–400)
RBC: 4.13 MIL/uL — AB (ref 4.22–5.81)
RDW: 14.5 % (ref 11.5–15.5)
WBC: 5.4 10*3/uL (ref 4.0–10.5)

## 2015-02-09 LAB — CREATININE, SERUM
Creatinine, Ser: 0.98 mg/dL (ref 0.61–1.24)
GFR calc non Af Amer: >60 mL/min (ref >60)

## 2015-02-09 LAB — SEDIMENTATION RATE: SED RATE: 25 mm/h — AB (ref 0–16)

## 2015-04-09 ENCOUNTER — Encounter: Payer: Self-pay | Admitting: Hematology and Oncology

## 2015-04-09 ENCOUNTER — Telehealth: Payer: Self-pay | Admitting: Hematology and Oncology

## 2015-04-09 NOTE — Telephone Encounter (Signed)
Mailed np packet Faxed np letter to referring office to contact pt with appt. Referring Dr. Mylo Red Dx-multiple myeloma-second opinion

## 2015-04-15 ENCOUNTER — Ambulatory Visit (HOSPITAL_BASED_OUTPATIENT_CLINIC_OR_DEPARTMENT_OTHER): Payer: BLUE CROSS/BLUE SHIELD | Admitting: Hematology and Oncology

## 2015-04-15 ENCOUNTER — Encounter: Payer: Self-pay | Admitting: Hematology and Oncology

## 2015-04-15 VITALS — BP 136/85 | HR 93 | Temp 98.1°F | Resp 20 | Ht 63.0 in | Wt 171.0 lb

## 2015-04-15 DIAGNOSIS — C9030 Solitary plasmacytoma not having achieved remission: Secondary | ICD-10-CM

## 2015-04-15 NOTE — Assessment & Plan Note (Signed)
We had a long discussion regarding whether or not the patient actually had diagnosis of plasmacytoma. We reviewed the actual open biopsy report from January which show clusters of plasma cells of lambda subtype suspicious for plasma cell neoplasm. We will get the biopsy specimen reviewed here. The patient and his wife wanted to believe that the cluster of plasma cells are related to infection/osteomyelitis and not due to cancer. Subsequent blood work and bone marrow biopsy were negative. PET/CT scan was abnormal in the area, which could be reactive to recent surgery, infection and/or disease. We discussed the pros and cons of pursuing further workup versus radiation therapy followed by observation. The patient is actually leaning towards not pursuing any further treatment or workup. He will go home and think about it. He'll call me in the near future regarding his decision. He has made an informed decision not to pursue clinical trial at [facility] In the meantime, I recommend vitamin D and calcium supplements.  There is additional benefit to prescribe Zometa but again, the patient is somewhat reluctant to consider that he actually had the diagnosis of cancer. Currently he is not symptomatic. We discussed extensively the natural history of plasmacytoma and multiple myeloma.  Regardless whether he had cancer diagnosis on not, he would need close monitoring and follow-up in the near future.

## 2015-04-15 NOTE — Progress Notes (Signed)
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 male is here for second opinion. I review his records extensively and collaborated the history with the patient and his wife Prior to November 2016, he denies history of trauma or IV drug use as a cause of his problem. 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 This may represent Paget's disease. Chronic infection and neoplasm not considered likely.   12/18/2014 Imaging MRI right humerus: Abnormal cortical thickening, periostitis, fluid signal intensity in the medullary space, and some bony lesions in the medullary space concerning for sequestrum. Chronic osteomyelitis is the top differential diagnostic consideration   01/16/2015 Surgery He underwent open bone biopsy in the right humerus    01/16/2015 Pathology Results The right humerus open biopsy demonstrate abundant plasma cells.  The plasma cellsare diffusely immunopositive for lambda and negative for kappa   01/21/2015 Miscellaneous He underwent PICC line placement and received 6 weeks course of IV antibiotics for osteomyelitis, cultures were positive for staph aureus   02/24/2015 Tumor Marker lab work and urine test including myeloma panel and light chain studies at [facility] are negative   03/12/2015 Bone Marrow Biopsy Bone marrow biopsy at [facility] showed no morphologic or immunophenotypic evidenceof bone marrow  involvement    03/21/2015 Imaging PET scan elsewhere: diffuse erosive changes of right humeral head. Sclerosis and endosteal scalloping of the right humeral diaphysis. Hypermetabolic activity is seen throughout the proximal right humerus, including the humeral head and humeral diaphysis   Currently, he denies pain. He denies history of abnormal bone pain or bone fracture. Patient denies history of recurrent infection or atypical infections such as shingles of meningitis. Denies chills, night sweats, anorexia or abnormal weight loss.  MEDICAL HISTORY:  History reviewed. No pertinent past medical history.  SURGICAL HISTORY: Past Surgical History  Procedure Laterality Date  . Right arm surgery    . Wisdom teeth surgery      SOCIAL HISTORY: Social History   Social History  . Marital Status: Married    Spouse Name: N/A  . Number of Children: N/A  . Years of Education: N/A   Occupational History  . Not on file.   Social History Main Topics  . Smoking status: Never Smoker   . Smokeless tobacco: Never Used  . Alcohol Use: Yes  . Drug Use: No  . 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 Not on file   Social History Narrative    FAMILY HISTORY: History reviewed. No pertinent family history.  ALLERGIES:  is allergic to penicillin g.  MEDICATIONS:  No current outpatient prescriptions on file.   No current facility-administered medications for this visit.    REVIEW OF SYSTEMS:   Eyes: Denies blurriness of vision, double vision or watery eyes Ears, nose, mouth, throat, and face: Denies mucositis or sore throat Respiratory: Denies cough, dyspnea or wheezes Cardiovascular: Denies palpitation, chest discomfort  or lower extremity swelling Gastrointestinal:  Denies nausea, heartburn or change in bowel habits Skin: Denies abnormal skin rashes Lymphatics: Denies new lymphadenopathy or easy  bruising Neurological:Denies numbness, tingling or new weaknesses Behavioral/Psych: Mood is stable, no new changes  All other systems were reviewed with the patient and are negative.  PHYSICAL EXAMINATION: ECOG PERFORMANCE STATUS: 0 - Asymptomatic  Filed Vitals:   04/15/15 1154  BP: 136/85  Pulse: 93  Temp: 98.1 F (36.7 C)  Resp: 20   Filed Weights   04/15/15 1154  Weight: 171 lb (77.565 kg)    GENERAL:alert, no distress and comfortable SKIN: skin color, texture, turgor are normal, no rashes or significant lesions EYES: normal, conjunctiva are pink and non-injected, sclera clear OROPHARYNX:no exudate, no erythema and lips, buccal mucosa, and tongue normal  NECK: supple, thyroid normal size, non-tender, without nodularity LYMPH:  no palpable lymphadenopathy in the cervical, axillary or inguinal LUNGS: clear to auscultation and percussion with normal breathing effort HEART: regular rate & rhythm and no murmurs and no lower extremity edema ABDOMEN:abdomen soft, non-tender and normal bowel sounds Musculoskeletal:no cyanosis of digits and no clubbing . Well-healed surgical scar PSYCH: alert & oriented x 3 with fluent speech NEURO: no focal motor/sensory deficits  LABORATORY DATA:  I have reviewed the data as listed Lab Results  Component Value Date   WBC 5.4 02/09/2015   HGB 12.3* 02/09/2015   HCT 37.3* 02/09/2015   MCV 90.3 02/09/2015   PLT 282 02/09/2015    RADIOGRAPHIC STUDIES: I reviewed x-ray and MRI with the patient and family I have personally reviewed the radiological images as listed and agreed with the findings in the report.   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 We reviewed the actual open biopsy report from January which show clusters of plasma cells of lambda subtype suspicious for plasma cell neoplasm. We will get the biopsy specimen reviewed  here. The patient and his wife wanted to believe that the cluster of plasma cells are related to infection/osteomyelitis and not due to cancer. Subsequent blood work and bone marrow biopsy were negative. PET/CT scan was abnormal in the area, which could be reactive to recent surgery, infection and/or disease. We discussed the pros and cons of pursuing further workup versus radiation therapy followed by observation. The patient is actually leaning towards not pursuing any further treatment or workup. He will go home and think about it. He'll call me in the near future regarding his decision. He has made an informed decision not to pursue clinical trial at [facility] In the meantime, I recommend vitamin D and calcium supplements.  There is additional benefit to prescribe Zometa but again, the patient is somewhat reluctant to consider that he actually had the diagnosis of cancer. Currently he is not symptomatic. We discussed extensively the natural history of plasmacytoma and multiple myeloma.  Regardless whether he had cancer diagnosis on not, he would need close monitoring and follow-up in the near future.    All questions were answered. The patient knows to call the clinic with any problems, questions or concerns. I spent 40 minutes counseling the patient face to face. The total time spent in the appointment was 60 minutes and more than 50% was on counseling.     [place], [place], MD 04/15/2015 3:30 PM

## 2015-04-16 ENCOUNTER — Telehealth: Payer: Self-pay | Admitting: *Deleted

## 2015-04-16 ENCOUNTER — Other Ambulatory Visit: Payer: Self-pay | Admitting: *Deleted

## 2015-04-16 ENCOUNTER — Other Ambulatory Visit: Payer: Self-pay | Admitting: Hematology and Oncology

## 2015-04-16 DIAGNOSIS — C9030 Solitary plasmacytoma not having achieved remission: Secondary | ICD-10-CM

## 2015-04-16 NOTE — Telephone Encounter (Signed)
Dr Alvy Bimler is requesting the pathology specimen from biopsy from January 16, 2015 be sent to [facility] so it can be reviewed for second opinion. Open biopsy was done at [facility].  This request being faxed to [facility] [phone].

## 2015-04-16 NOTE — Telephone Encounter (Signed)
I placed order. Please call them. I will place return appt once I know the plan for radiation

## 2015-04-16 NOTE — Telephone Encounter (Signed)
Pt's wife states patient has decided to proceed with radiation therapy as discussed yesterday with Dr Alvy Bimler. Would like to have early morning appts or late afternoon appts if possible

## 2015-04-17 ENCOUNTER — Encounter: Payer: Self-pay | Admitting: Radiation Oncology

## 2015-04-22 ENCOUNTER — Other Ambulatory Visit: Payer: Self-pay | Admitting: Hematology and Oncology

## 2015-05-05 NOTE — Progress Notes (Signed)
Histology and Location of Primary Cancer: Plasmacytoma of bone  A, B. BONE, RIGHT HUMERUS, BIOPSIES:01-16-15 Plasma cell neoplasm, lambda positive. Acute osteomyelitis  Sites of Visceral and Bony Metastatic Disease:Right humerus  Location(s) of Symptomatic Metastases: Right humerus  Past/Anticipated chemotherapy by medical oncology, if any:12-18-14 MRI right humerus  Pain on a scale of 0-10 is:  None   If Spine Met(s), symptoms, if any, include:None  Bowel/Bladder retention or incontinence (please describe): N/A  Numbness or weakness in extremities (please describe): No  Current Decadron regimen, if applicable:No  Ambulatory status? Walker? Wheelchair?: No  SAFETY ISSUES:  Prior radiation? :No  Pacemaker/ICD? :No  Possible current pregnancy? :No  Is the patient on methotrexate? :No  Current Complaints / other details:Here to discuss radiation treatment.

## 2015-05-07 ENCOUNTER — Encounter: Payer: Self-pay | Admitting: Radiation Oncology

## 2015-05-07 ENCOUNTER — Ambulatory Visit (HOSPITAL_COMMUNITY)
Admission: RE | Admit: 2015-05-07 | Discharge: 2015-05-07 | Disposition: A | Discharge status: Home / Self Care | Payer: BLUE CROSS/BLUE SHIELD | Source: Ambulatory Visit | Attending: Radiation Oncology | Admitting: Radiation Oncology

## 2015-05-07 ENCOUNTER — Ambulatory Visit
Admission: RE | Admit: 2015-05-07 | Payer: BLUE CROSS/BLUE SHIELD | Source: Ambulatory Visit | Admitting: Radiation Oncology

## 2015-05-07 ENCOUNTER — Ambulatory Visit
Admission: RE | Admit: 2015-05-07 | Discharge: 2015-05-07 | Disposition: A | Discharge status: Home / Self Care | Payer: BLUE CROSS/BLUE SHIELD | Source: Ambulatory Visit | Attending: Radiation Oncology | Admitting: Radiation Oncology

## 2015-05-07 ENCOUNTER — Encounter: Payer: Self-pay | Admitting: *Deleted

## 2015-05-07 VITALS — BP 136/89 | HR 71 | Temp 98.1°F | Resp 18 | Ht 63.0 in | Wt 168.0 lb

## 2015-05-07 DIAGNOSIS — C9030 Solitary plasmacytoma not having achieved remission: Secondary | ICD-10-CM | POA: Insufficient documentation

## 2015-05-07 DIAGNOSIS — M899 Disorder of bone, unspecified: Secondary | ICD-10-CM | POA: Diagnosis not present

## 2015-05-07 NOTE — Progress Notes (Signed)
[place] lab for pathology slide results spoke with Tammy no results found for this patient. 0915 Repeat call back to the lab to double check for the pathology slide results told there was no pathology information on file for this patient.

## 2015-05-07 NOTE — Progress Notes (Signed)
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 male who reported a 3 week period of shoulder pain that he initially believed to be due to his work in [employer]. He underwent imaging studies on 12/02/14 which showed thickening of the humerous. An MRI on 12/18/14 revealed changes in the humerous. He underwent open biopsy and reaming of the right humerus on 01/16/15. Cultures at this time grew staphylococcus  and showed plasma cell infiltrate with lambda restriction consistent with plasmacytoma. He then underwent 6 weeks of IV antibiotics and was seen in the [place] clinic on 03/12/2015. A PET scan on 03/21/15 showed a solitary lesion in the right humerous with no other areas of involvement. A bone marrow biopsy showed no evidence of clonal plasma cells. There is still question about whether Mr. Raju has cancer or an infection of the bone. We are awaiting a second opinion pathology report as well as further imaging to determine a true diagnosis.    Today, he denies any pain. He aslo denies any bowel/bladder retention or numbness/weakness in the extremities. He has no family history of malignancy and no personal history of cancer. No IV drug use.   PREVIOUS RADIATION THERAPY: No  Past medical, social and family history were reviewed in the electronic chart. Review of symptoms was reviewed in the electronic chart. Medications were reviewed in the electronic chart.   PHYSICAL EXAM:  Filed Vitals:   05/07/15 0846  BP: 136/89  Pulse: 71  Temp: 98.1 F (36.7 C)  Resp: 18  .168 lb (76.204 kg).   This is a pleasant male in no acute distress. He is alert and oriented. No pain in her right  humerus.  IMPRESSION: Mr. Mccauley is a 40 y.o male with a possible diagnosis of plasmacytoma of the right humerus with another differential as osteomyelitis.  PLAN: We repeated a plain film today of his entire right humerus. The X-ray report from today shows cortical thickening was improved and the rest of the film was stable although it was difficult to compare a shoulder film to a humerus film. There are no lytic lesions mentioned in any report which is unusual.  I have spoken with Dr. Mylo Red who felt repeat imaging and observation was reasonable. She did not feel any imaging tests would be helpful at this time as there would still be changes from the surgical procedure on PET and MRI.  I also spoke with Dr. Norma Fredrickson at [facility] in medical oncology (ph # [phone]) who felt either treatment or observation were reasonable.   I spoke with Dr. Gari Crown today who has the blocks from [facility] and is going to repeat stains from the slides that he has received from [facility]. There is a clonal population of plasma cells; however, the kappa and lambda stains are incongruent with that diagnosis. He hopes to have these results done by Friday and will call me. I will call the patient's wife at his request and propose a plan from there.    I spent 60 minutes with the patient and more than 50% of that time was spent in counseling and/or coordination of care.   ------------------------------------------------  Thea Silversmith, MD  This document serves as a record of  services personally performed by Thea Silversmith, MD. It was created on her behalf by Jenell Milliner, a trained medical scribe. The creation of this record is based on the scribe's personal observations and the provider's statements to them. This document has been checked and approved by the attending provider.

## 2015-05-07 NOTE — Progress Notes (Signed)
I spoke to Dr. Gari Crown who has received the slides from [facility].  He has requested blocks and is reviewing them. He should have the pathology back on Friday.

## 2015-05-13 ENCOUNTER — Telehealth: Payer: Self-pay | Admitting: *Deleted

## 2015-05-13 ENCOUNTER — Other Ambulatory Visit: Payer: Self-pay | Admitting: Radiation Oncology

## 2015-05-13 DIAGNOSIS — M869 Osteomyelitis, unspecified: Secondary | ICD-10-CM | POA: Insufficient documentation

## 2015-05-13 DIAGNOSIS — M86121 Other acute osteomyelitis, right humerus: Secondary | ICD-10-CM

## 2015-05-13 NOTE — Telephone Encounter (Signed)
Mailed patient pathology report as Dr. Pablo Ledger requested.

## 2015-05-13 NOTE — Telephone Encounter (Signed)
CALLED PATIENT TO INFORM OF X-RAY AND FU ON 09-18-15, LVM FOR A RETURN CALL

## 2015-05-14 ENCOUNTER — Other Ambulatory Visit: Payer: Self-pay | Admitting: Hematology and Oncology

## 2015-05-14 ENCOUNTER — Telehealth: Payer: Self-pay | Admitting: Hematology and Oncology

## 2015-05-14 DIAGNOSIS — C9030 Solitary plasmacytoma not having achieved remission: Secondary | ICD-10-CM

## 2015-05-14 NOTE — Telephone Encounter (Signed)
I have discussed outside biopsy review by our pathologist here. Report issued recently did not come firmed diagnosis of plasmacytoma, more consistent with osteomyelitis. I will recommend repeat another set of myeloma panel along with bone survey. If repeat test again is negative, I will discharge the patient from the clinic.

## 2015-05-14 NOTE — Telephone Encounter (Signed)
s.w. pt and advised on Sept appts.....pt ok and aware °

## 2015-07-14 ENCOUNTER — Telehealth: Payer: Self-pay | Admitting: *Deleted

## 2015-09-11 ENCOUNTER — Other Ambulatory Visit: Payer: Self-pay

## 2015-09-11 ENCOUNTER — Other Ambulatory Visit (HOSPITAL_BASED_OUTPATIENT_CLINIC_OR_DEPARTMENT_OTHER): Payer: BLUE CROSS/BLUE SHIELD

## 2015-09-11 ENCOUNTER — Ambulatory Visit: Payer: Self-pay | Admitting: Hematology and Oncology

## 2015-09-11 DIAGNOSIS — C9030 Solitary plasmacytoma not having achieved remission: Secondary | ICD-10-CM | POA: Diagnosis not present

## 2015-09-11 LAB — CBC WITH DIFFERENTIAL/PLATELET
BASO%: 2.7 % — AB (ref 0.0–2.0)
Basophils Absolute: 0.1 10*3/uL (ref 0.0–0.1)
EOS%: 2.4 % (ref 0.0–7.0)
Eosinophils Absolute: 0.1 10*3/uL (ref 0.0–0.5)
HEMATOCRIT: 47.5 % (ref 38.4–49.9)
HGB: 15.8 g/dL (ref 13.0–17.1)
LYMPH#: 1.5 10*3/uL (ref 0.9–3.3)
LYMPH%: 28.2 % (ref 14.0–49.0)
MCH: 31 pg (ref 27.2–33.4)
MCHC: 33.2 g/dL (ref 32.0–36.0)
MCV: 93.3 fL (ref 79.3–98.0)
MONO#: 0.4 10*3/uL (ref 0.1–0.9)
MONO%: 7.3 % (ref 0.0–14.0)
NEUT#: 3.2 10*3/uL (ref 1.5–6.5)
NEUT%: 59.4 % (ref 39.0–75.0)
Platelets: 304 10*3/uL (ref 140–400)
RBC: 5.09 10*6/uL (ref 4.20–5.82)
RDW: 13.4 % (ref 11.0–14.6)
WBC: 5.4 10*3/uL (ref 4.0–10.3)

## 2015-09-11 LAB — COMPREHENSIVE METABOLIC PANEL
ALT: 32 U/L (ref 0–55)
AST: 25 U/L (ref 5–34)
Albumin: 4.1 g/dL (ref 3.5–5.0)
Alkaline Phosphatase: 76 U/L (ref 40–150)
Anion Gap: 12 mEq/L — ABNORMAL HIGH (ref 3–11)
BUN: 15.4 mg/dL (ref 7.0–26.0)
CALCIUM: 10 mg/dL (ref 8.4–10.4)
CHLORIDE: 102 meq/L (ref 98–109)
CO2: 26 meq/L (ref 22–29)
CREATININE: 1 mg/dL (ref 0.7–1.3)
EGFR: >90 mL/min/{1.73_m2} (ref >90)
GLUCOSE: 94 mg/dL (ref 70–140)
POTASSIUM: 3.9 meq/L (ref 3.5–5.1)
SODIUM: 139 meq/L (ref 136–145)
Total Bilirubin: 0.51 mg/dL (ref 0.20–1.20)
Total Protein: 8.1 g/dL (ref 6.4–8.3)

## 2015-09-12 LAB — KAPPA/LAMBDA LIGHT CHAINS
Ig Kappa Free Light Chain: 14.5 mg/L (ref 3.3–19.4)
Ig Lambda Free Light Chain: 15.1 mg/L (ref 5.7–26.3)
Kappa/Lambda FluidC Ratio: 0.96 (ref 0.26–1.65)

## 2015-09-16 ENCOUNTER — Other Ambulatory Visit: Payer: Self-pay | Admitting: Radiation Oncology

## 2015-09-16 DIAGNOSIS — C9030 Solitary plasmacytoma not having achieved remission: Secondary | ICD-10-CM

## 2015-09-16 LAB — MULTIPLE MYELOMA PANEL, SERUM
ALBUMIN/GLOB SERPL: 1.4 (ref 0.7–1.7)
ALPHA 1: 0.2 g/dL (ref 0.0–0.4)
ALPHA2 GLOB SERPL ELPH-MCNC: 0.7 g/dL (ref 0.4–1.0)
Albumin SerPl Elph-Mcnc: 4.3 g/dL (ref 2.9–4.4)
B-GLOBULIN SERPL ELPH-MCNC: 1.2 g/dL (ref 0.7–1.3)
Gamma Glob SerPl Elph-Mcnc: 1.1 g/dL (ref 0.4–1.8)
Globulin, Total: 3.3 g/dL (ref 2.2–3.9)
IGM (IMMUNOGLOBIN M), SRM: 70 mg/dL (ref 20–172)
IgA, Qn, Serum: 301 mg/dL (ref 90–386)
TOTAL PROTEIN: 7.6 g/dL (ref 6.0–8.5)

## 2015-09-17 ENCOUNTER — Telehealth: Payer: Self-pay | Admitting: *Deleted

## 2015-09-17 NOTE — Telephone Encounter (Signed)
Called Mr. Cofone to remind him that he needs to have an Xray of his right humerus.  This has been ordered for 09/18/15 at 12 noon prior to his appointment, with Medical Oncology and Radiation Oncology.  He stated agreement, and per his request, a text message was sent by this RN to his phone as a reminder with directions on how to go to admitting for check-in.  Called Joy in Radiology to inform of call. Mr. Pruitte as a reminder.

## 2015-09-18 ENCOUNTER — Telehealth: Payer: Self-pay | Admitting: Hematology and Oncology

## 2015-09-18 ENCOUNTER — Ambulatory Visit (HOSPITAL_BASED_OUTPATIENT_CLINIC_OR_DEPARTMENT_OTHER): Payer: BLUE CROSS/BLUE SHIELD | Admitting: Hematology and Oncology

## 2015-09-18 ENCOUNTER — Encounter: Payer: Self-pay | Admitting: *Deleted

## 2015-09-18 ENCOUNTER — Ambulatory Visit
Admission: RE | Admit: 2015-09-18 | Payer: BLUE CROSS/BLUE SHIELD | Source: Ambulatory Visit | Admitting: Radiation Oncology

## 2015-09-18 ENCOUNTER — Encounter: Payer: Self-pay | Admitting: Hematology and Oncology

## 2015-09-18 ENCOUNTER — Ambulatory Visit (HOSPITAL_COMMUNITY)
Admission: RE | Admit: 2015-09-18 | Discharge: 2015-09-18 | Disposition: A | Discharge status: Home / Self Care | Payer: BLUE CROSS/BLUE SHIELD | Source: Ambulatory Visit | Attending: Radiation Oncology | Admitting: Radiation Oncology

## 2015-09-18 VITALS — BP 149/89 | HR 69 | Temp 98.3°F | Resp 18 | Wt 165.3 lb

## 2015-09-18 DIAGNOSIS — C9030 Solitary plasmacytoma not having achieved remission: Secondary | ICD-10-CM | POA: Insufficient documentation

## 2015-09-18 NOTE — Progress Notes (Signed)
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 This may represent Paget's disease. Chronic infection and neoplasm not considered likely.      12/18/2014 Imaging    MRI right humerus: Abnormal cortical thickening, periostitis, fluid signal intensity in the medullary space, and some bony lesions in the medullary space concerning for sequestrum. Chronic osteomyelitis is the top differential diagnostic consideration      01/16/2015 Surgery    He underwent open bone biopsy in the right humerus       01/16/2015 Pathology Results    The right humerus open biopsy demonstrate abundant plasma cells.  The plasma cellsare diffusely immunopositive for lambda and negative for kappa      01/21/2015 Miscellaneous    He underwent PICC line placement and received 6 weeks course of IV antibiotics for osteomyelitis, cultures were positive for staph aureus      02/24/2015 Tumor Marker    lab work and urine test including myeloma panel and light chain studies at [facility] are negative      03/12/2015 Bone Marrow Biopsy    Bone marrow biopsy at [facility] showed no morphologic or immunophenotypic evidenceof bone marrow involvement       03/21/2015 Imaging    PET scan elsewhere: diffuse erosive changes of right humeral head. Sclerosis and endosteal scalloping of the right humeral diaphysis. Hypermetabolic activity is seen throughout the proximal right humerus, including the humeral head and humeral diaphysis       INTERVAL HISTORY: Please see below for problem oriented charting. He feels well. Denies recent  infection. No new bone pain. His right arm continues to feel strong with only minor discomfort with moderate physical exertion  REVIEW OF SYSTEMS:   Constitutional: Denies fevers, chills or abnormal weight loss Eyes: Denies blurriness of vision Ears, nose, mouth, throat, and face: Denies mucositis or sore throat Respiratory: Denies cough, dyspnea or wheezes Cardiovascular: Denies palpitation, chest discomfort or lower extremity swelling Gastrointestinal:  Denies nausea, heartburn or change in bowel habits Skin: Denies abnormal skin rashes Lymphatics: Denies new lymphadenopathy or easy bruising Neurological:Denies numbness, tingling or new weaknesses Behavioral/Psych: Mood is stable, no new changes  All other systems were reviewed with the patient and are negative.  I have reviewed the past medical history, past surgical history, social history and family history with the patient and they are unchanged from previous note.  ALLERGIES:  is allergic to penicillin g.  MEDICATIONS:  No current outpatient prescriptions on file.   No current facility-administered medications for this visit.     PHYSICAL EXAMINATION: ECOG PERFORMANCE STATUS: 0 - Asymptomatic  Vitals:   09/18/15 1137  BP: (!) 149/89  Pulse: 69  Resp: 18  Temp: 98.3 F (36.8 C)   Filed Weights   09/18/15 1137  Weight: 165 lb 4.8 oz (75 kg)    GENERAL:alert, no distress and comfortable SKIN: skin color, texture, turgor are normal, no rashes or significant lesions EYES: normal, Conjunctiva are pink and non-injected, sclera clear Musculoskeletal:no cyanosis of digits and no clubbing  NEURO: alert & oriented x 3 with fluent speech, no focal motor/sensory deficits  LABORATORY DATA:  I  have reviewed the data as listed    Component Value Date/Time   NA 139 09/11/2015 0827   K 3.9 09/11/2015 0827   CL 104 12/31/2013 1632   CO2 26 09/11/2015 0827   GLUCOSE 94 09/11/2015 0827   BUN 15.4 09/11/2015 0827    CREATININE 1.0 09/11/2015 0827   CALCIUM 10.0 09/11/2015 0827   PROT 8.1 09/11/2015 0827   PROT 7.6 09/11/2015 0827   ALBUMIN 4.1 09/11/2015 0827   AST 25 09/11/2015 0827   ALT 32 09/11/2015 0827   ALKPHOS 76 09/11/2015 0827   BILITOT 0.51 09/11/2015 0827   GFRNONAA >60 02/09/2015 1844   GFRAA >60 02/09/2015 1844    No results found for: SPEP, UPEP  Lab Results  Component Value Date   WBC 5.4 09/11/2015   NEUTROABS 3.2 09/11/2015   HGB 15.8 09/11/2015   HCT 47.5 09/11/2015   MCV 93.3 09/11/2015   PLT 304 09/11/2015      Chemistry      Component Value Date/Time   NA 139 09/11/2015 0827   K 3.9 09/11/2015 0827   CL 104 12/31/2013 1632   CO2 26 09/11/2015 0827   BUN 15.4 09/11/2015 0827   CREATININE 1.0 09/11/2015 0827      Component Value Date/Time   CALCIUM 10.0 09/11/2015 0827   ALKPHOS 76 09/11/2015 0827   AST 25 09/11/2015 0827   ALT 32 09/11/2015 0827   BILITOT 0.51 09/11/2015 0827       RADIOGRAPHIC STUDIES:I reviewed the x-ray which show no new lesion I have personally reviewed the radiological images as listed and agreed with the findings in the report.    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 Blood work is completely normal. I reviewed the x-ray of his right humerus that appears stable We discussed extensively the natural history of plasmacytoma and multiple myeloma.  I recommend return visit in 9 months with blood work monitoring and repeat x-ray and he agreed    Orders Placed This Encounter  Procedures  . DG Humerus Right    Standing Status:   Future    Standing Expiration Date:   09/17/2016    Order Specific Question:   Reason for Exam (SYMPTOM  OR DIAGNOSIS REQUIRED)    Answer:   hx bone infection right humerus, ?plasmacytoma, assess    Order Specific Question:   Preferred imaging location?    Answer:    [facility]  . CBC with Differential/Platelet    Standing Status:   Future    Standing Expiration Date:   10/22/2016  . Comprehensive metabolic panel    Standing Status:   Future    Standing Expiration Date:   10/22/2016  . Kappa/lambda light chains    Standing Status:   Future    Standing Expiration Date:   10/22/2016  . Multiple Myeloma Panel (SPEP&IFE w/QIG)    Standing Status:   Future    Standing Expiration Date:   10/22/2016   All questions were answered. The patient knows to call the clinic with any problems, questions or concerns. No barriers to learning was detected. I spent 15 minutes counseling the patient face to face. The total time spent in the appointment was 20 minutes and more than 50% was on counseling and review of test results     [facility], [place], MD 09/18/2015 2:06 PM

## 2015-09-18 NOTE — Telephone Encounter (Signed)
Avs report and schedule given to patient, per 09/18/15 los. °

## 2015-09-18 NOTE — Progress Notes (Signed)
Golf spoke with Mr. Carl Odom he went home today after his appointment with Dr. Alvy Bimler.  Mr. Carl Odom explained that he received the review of his xray results and they were okay and he just went home.  He did not think at the time that he still needed to come down to Radiation oncology to receive the results.  I let him know that I would let the situation be known to Dr. Lisbeth Renshaw and Shona Simpson, P.A. and he would be rescheduled if needed.

## 2015-09-18 NOTE — Assessment & Plan Note (Signed)
His case was presented many times in the past and overall we felt that it is not consistent with plasmacytoma or multiple myeloma. Blood work is completely normal. I reviewed the x-ray of his right humerus that appears stable We discussed extensively the natural history of plasmacytoma and multiple myeloma.  I recommend return visit in 9 months with blood work monitoring and repeat x-ray and he agreed

## 2016-06-10 ENCOUNTER — Other Ambulatory Visit (HOSPITAL_BASED_OUTPATIENT_CLINIC_OR_DEPARTMENT_OTHER): Payer: BLUE CROSS/BLUE SHIELD

## 2016-06-10 DIAGNOSIS — C9030 Solitary plasmacytoma not having achieved remission: Secondary | ICD-10-CM | POA: Diagnosis not present

## 2016-06-10 LAB — CBC WITH DIFFERENTIAL/PLATELET
BASO%: 0.8 % (ref 0.0–2.0)
Basophils Absolute: 0 10*3/uL (ref 0.0–0.1)
EOS ABS: 0.1 10*3/uL (ref 0.0–0.5)
EOS%: 2.3 % (ref 0.0–7.0)
HCT: 46.6 % (ref 38.4–49.9)
HGB: 16 g/dL (ref 13.0–17.1)
LYMPH%: 41.2 % (ref 14.0–49.0)
MCH: 32.4 pg (ref 27.2–33.4)
MCHC: 34.4 g/dL (ref 32.0–36.0)
MCV: 94.1 fL (ref 79.3–98.0)
MONO#: 0.4 10*3/uL (ref 0.1–0.9)
MONO%: 8.3 % (ref 0.0–14.0)
NEUT%: 47.4 % (ref 39.0–75.0)
NEUTROS ABS: 2.5 10*3/uL (ref 1.5–6.5)
Platelets: 259 10*3/uL (ref 140–400)
RBC: 4.95 10*6/uL (ref 4.20–5.82)
RDW: 13.3 % (ref 11.0–14.6)
WBC: 5.4 10*3/uL (ref 4.0–10.3)
lymph#: 2.2 10*3/uL (ref 0.9–3.3)

## 2016-06-10 LAB — COMPREHENSIVE METABOLIC PANEL
ALT: 25 U/L (ref 0–55)
AST: 25 U/L (ref 5–34)
Albumin: 4.4 g/dL (ref 3.5–5.0)
Alkaline Phosphatase: 83 U/L (ref 40–150)
Anion Gap: 10 mEq/L (ref 3–11)
BUN: 19.1 mg/dL (ref 7.0–26.0)
CALCIUM: 9.9 mg/dL (ref 8.4–10.4)
CHLORIDE: 105 meq/L (ref 98–109)
CO2: 25 mEq/L (ref 22–29)
CREATININE: 0.9 mg/dL (ref 0.7–1.3)
EGFR: >90 mL/min/{1.73_m2} (ref >90)
Glucose: 93 mg/dl (ref 70–140)
Potassium: 4.2 mEq/L (ref 3.5–5.1)
Sodium: 140 mEq/L (ref 136–145)
Total Bilirubin: 0.45 mg/dL (ref 0.20–1.20)
Total Protein: 7.7 g/dL (ref 6.4–8.3)

## 2016-06-11 LAB — KAPPA/LAMBDA LIGHT CHAINS
IG LAMBDA FREE LIGHT CHAIN: 13.4 mg/L (ref 5.7–26.3)
Ig Kappa Free Light Chain: 10.8 mg/L (ref 3.3–19.4)
Kappa/Lambda FluidC Ratio: 0.81 (ref 0.26–1.65)

## 2016-06-14 ENCOUNTER — Telehealth: Payer: Self-pay

## 2016-06-14 LAB — MULTIPLE MYELOMA PANEL, SERUM
ALBUMIN/GLOB SERPL: 1.4 (ref 0.7–1.7)
Albumin SerPl Elph-Mcnc: 4.2 g/dL (ref 2.9–4.4)
Alpha 1: 0.2 g/dL (ref 0.0–0.4)
Alpha2 Glob SerPl Elph-Mcnc: 0.6 g/dL (ref 0.4–1.0)
B-Globulin SerPl Elph-Mcnc: 1.2 g/dL (ref 0.7–1.3)
GAMMA GLOB SERPL ELPH-MCNC: 1.1 g/dL (ref 0.4–1.8)
GLOBULIN, TOTAL: 3.1 g/dL (ref 2.2–3.9)
IGA/IMMUNOGLOBULIN A, SERUM: 286 mg/dL (ref 90–386)
IGM (IMMUNOGLOBIN M), SRM: 70 mg/dL (ref 20–172)
IgG, Qn, Serum: 983 mg/dL (ref 700–1600)
Total Protein: 7.3 g/dL (ref 6.0–8.5)

## 2016-06-14 NOTE — Telephone Encounter (Signed)
Called and left below message. 

## 2016-06-14 NOTE — Telephone Encounter (Signed)
-----   Message from Heath Lark, MD sent at 06/14/2016  2:52 PM EDT ----- Regarding: x ray of bone pls remind patient he needs X ray done before his appt on Thursday He can do it as walk-in

## 2016-06-16 ENCOUNTER — Other Ambulatory Visit: Payer: Self-pay | Admitting: Hematology and Oncology

## 2016-06-16 ENCOUNTER — Ambulatory Visit (HOSPITAL_COMMUNITY)
Admission: RE | Admit: 2016-06-16 | Discharge: 2016-06-16 | Disposition: A | Discharge status: Home / Self Care | Payer: BLUE CROSS/BLUE SHIELD | Source: Ambulatory Visit | Attending: Hematology and Oncology | Admitting: Hematology and Oncology

## 2016-06-16 DIAGNOSIS — M868X1 Other osteomyelitis, shoulder: Secondary | ICD-10-CM | POA: Diagnosis not present

## 2016-06-16 DIAGNOSIS — C9030 Solitary plasmacytoma not having achieved remission: Secondary | ICD-10-CM | POA: Diagnosis not present

## 2016-06-17 ENCOUNTER — Encounter: Payer: Self-pay | Admitting: Hematology and Oncology

## 2016-06-17 ENCOUNTER — Ambulatory Visit (HOSPITAL_BASED_OUTPATIENT_CLINIC_OR_DEPARTMENT_OTHER): Payer: BLUE CROSS/BLUE SHIELD | Admitting: Hematology and Oncology

## 2016-06-17 DIAGNOSIS — C9030 Solitary plasmacytoma not having achieved remission: Secondary | ICD-10-CM

## 2016-06-17 NOTE — Assessment & Plan Note (Signed)
His case was presented many times in the past and overall we felt that it is not consistent with plasmacytoma or multiple myeloma. Blood work is completely normal. I reviewed the x-ray of his right humerus that appears stable with no acute abnormalities At this point, with negative blood work and x-ray, I would discharge the patient I do educate him to contact me in the future if he has new bone pain, abnormal weight loss or recurrent infection

## 2016-06-17 NOTE — Progress Notes (Signed)
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 This may represent Paget's disease. Chronic infection and neoplasm not considered likely.      12/18/2014 Imaging    MRI right humerus: Abnormal cortical thickening, periostitis, fluid signal intensity in the medullary space, and some bony lesions in the medullary space concerning for sequestrum. Chronic osteomyelitis is the top differential diagnostic consideration      01/16/2015 Surgery    He underwent open bone biopsy in the right humerus       01/16/2015 Pathology Results    The right humerus open biopsy demonstrate abundant plasma cells.  The plasma cellsare diffusely immunopositive for lambda and negative for kappa      01/21/2015 Miscellaneous    He underwent PICC line placement and received 6 weeks course of IV antibiotics for osteomyelitis, cultures were positive for staph aureus      02/24/2015 Tumor Marker    lab work and urine test including myeloma panel and light chain studies at [facility] are negative      03/12/2015 Bone Marrow Biopsy    Bone marrow biopsy at [facility] showed no morphologic or immunophenotypic evidenceof bone marrow involvement       03/21/2015 Imaging    PET scan elsewhere: diffuse erosive changes of right humeral head. Sclerosis and endosteal scalloping of the right humeral diaphysis. Hypermetabolic activity is seen throughout the proximal right humerus, including the humeral head and humeral diaphysis       INTERVAL HISTORY: Please see below for problem oriented charting. He returns for further follow-up He  feels well Denies recent infection No new bone pain.  REVIEW OF SYSTEMS:   Constitutional: Denies fevers, chills or abnormal weight loss Eyes: Denies blurriness of vision Ears, nose, mouth, throat, and face: Denies mucositis or sore throat Respiratory: Denies cough, dyspnea or wheezes Cardiovascular: Denies palpitation, chest discomfort or lower extremity swelling Gastrointestinal:  Denies nausea, heartburn or change in bowel habits Skin: Denies abnormal skin rashes Lymphatics: Denies new lymphadenopathy or easy bruising Neurological:Denies numbness, tingling or new weaknesses Behavioral/Psych: Mood is stable, no new changes  All other systems were reviewed with the patient and are negative.  I have reviewed the past medical history, past surgical history, social history and family history with the patient and they are unchanged from previous note.  ALLERGIES:  is allergic to penicillin g.  MEDICATIONS:  No current outpatient prescriptions on file.   No current facility-administered medications for this visit.     PHYSICAL EXAMINATION: ECOG PERFORMANCE STATUS: 0 - Asymptomatic  Vitals:   06/17/16 0846  BP: (!) 145/78  Pulse: 71  Resp: 20  Temp: 98.8 F (37.1 C)   Filed Weights   06/17/16 0846  Weight: 164 lb 14.4 oz (74.8 kg)    GENERAL:alert, no distress and comfortable SKIN: skin color, texture, turgor are normal, no rashes or significant lesions EYES: normal, Conjunctiva are pink and non-injected, sclera clear Musculoskeletal:no cyanosis of digits and no clubbing  NEURO: alert & oriented x 3 with fluent speech, no focal motor/sensory deficits  LABORATORY DATA:  I have reviewed the data as listed    Component  Value Date/Time   NA 140 06/10/2016 0826   K 4.2 06/10/2016 0826   CL 104 12/31/2013 1632   CO2 25 06/10/2016 0826   GLUCOSE 93 06/10/2016 0826   BUN 19.1 06/10/2016 0826   CREATININE 0.9 06/10/2016 0826   CALCIUM 9.9 06/10/2016 0826   PROT 7.7  06/10/2016 0826   PROT 7.3 06/10/2016 0826   ALBUMIN 4.4 06/10/2016 0826   AST 25 06/10/2016 0826   ALT 25 06/10/2016 0826   ALKPHOS 83 06/10/2016 0826   BILITOT 0.45 06/10/2016 0826   GFRNONAA >60 02/09/2015 1844   GFRAA >60 02/09/2015 1844    No results found for: SPEP, UPEP  Lab Results  Component Value Date   WBC 5.4 06/10/2016   NEUTROABS 2.5 06/10/2016   HGB 16.0 06/10/2016   HCT 46.6 06/10/2016   MCV 94.1 06/10/2016   PLT 259 06/10/2016      Chemistry      Component Value Date/Time   NA 140 06/10/2016 0826   K 4.2 06/10/2016 0826   CL 104 12/31/2013 1632   CO2 25 06/10/2016 0826   BUN 19.1 06/10/2016 0826   CREATININE 0.9 06/10/2016 0826      Component Value Date/Time   CALCIUM 9.9 06/10/2016 0826   ALKPHOS 83 06/10/2016 0826   AST 25 06/10/2016 0826   ALT 25 06/10/2016 0826   BILITOT 0.45 06/10/2016 0826       RADIOGRAPHIC STUDIES: I reviewed the labs and x-ray finding with the patient I have personally reviewed the radiological images as listed and agreed with the findings in the report. Dg Humerus Right  Result Date: 06/16/2016 CLINICAL DATA:  History of osteomyelitis of the right humerus with surgery to remove infectious tissue. Six-month follow-up surveillance x-ray. No current symptoms. EXAM: RIGHT HUMERUS - 2+ VIEW COMPARISON:  Right humerus radiograph of September 18 2015 and May 07, 2015 FINDINGS: There is a stable sclerotic region in the midshaft of the humerus. The appearance of the cortex appears stable as well. There is no endosteal scalloping or periosteal reaction. The proximal and distal portions of the humerus appear normal. IMPRESSION: There is no acute bony abnormality of the right humerus. Electronically Signed   By: David  Martinique M.D.   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 Blood work is completely normal. I reviewed the x-ray of his right humerus that appears stable with no acute abnormalities At this point, with negative blood work and x-ray, I would discharge the patient I do educate him to contact me in the future if he has new bone pain, abnormal weight loss or recurrent infection   No orders of the defined types were placed in this encounter.  All questions were answered. The patient knows to call the clinic with any problems, questions or concerns. No barriers to learning was detected. I spent 10 minutes counseling the patient face to face. The total time spent in the appointment was 15 minutes and more than 50% was on counseling and review of test results     Heath Lark, MD 06/17/2016 9:14 AM

## 2017-06-06 ENCOUNTER — Encounter (HOSPITAL_COMMUNITY): Payer: Self-pay | Admitting: Emergency Medicine

## 2017-06-06 ENCOUNTER — Other Ambulatory Visit: Payer: Self-pay

## 2017-06-06 ENCOUNTER — Ambulatory Visit (HOSPITAL_COMMUNITY)
Admission: EM | Admit: 2017-06-06 | Discharge: 2017-06-06 | Disposition: A | Discharge status: Home / Self Care | Payer: BLUE CROSS/BLUE SHIELD | Attending: Family Medicine | Admitting: Family Medicine

## 2017-06-06 DIAGNOSIS — Z23 Encounter for immunization: Secondary | ICD-10-CM | POA: Diagnosis not present

## 2017-06-06 DIAGNOSIS — W268XXA Contact with other sharp object(s), not elsewhere classified, initial encounter: Secondary | ICD-10-CM

## 2017-06-06 DIAGNOSIS — S91114A Laceration without foreign body of right lesser toe(s) without damage to nail, initial encounter: Secondary | ICD-10-CM

## 2017-06-06 MED ORDER — LIDOCAINE HCL 2 % IJ SOLN
INTRAMUSCULAR | Status: AC
  Filled 2017-06-06: qty 20

## 2017-06-06 MED ORDER — TETANUS-DIPHTH-ACELL PERTUSSIS 5-2.5-18.5 LF-MCG/0.5 IM SUSP
INTRAMUSCULAR | Status: AC
  Filled 2017-06-06: qty 0.5

## 2017-06-06 MED ORDER — TETANUS-DIPHTH-ACELL PERTUSSIS 5-2.5-18.5 LF-MCG/0.5 IM SUSP
0.5000 mL | Freq: Once | INTRAMUSCULAR | Status: AC
  Administered 2017-06-06: 0.5 mL via INTRAMUSCULAR
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
## 2017-06-06 NOTE — Discharge Instructions (Signed)
Tetanus updated. 2 sutures placed. You can remove current dressing in 24 hours. Do not soak wound. Otherwise, you can clean gently with soap and water. Daily dressing. Monitor for spreading redness, increased warmth, fever, follow up for reevaluation. Otherwise, follow up in 7 days for suture removal.

## 2017-06-12 IMAGING — DX DG HUMERUS 2V *R*
2 series · 2 of 2 positions shown · non-contrast
Comparison: None.

CLINICAL DATA: Right shoulder pain

EXAM:
RIGHT HUMERUS - 2+ VIEW

[humerus ap]
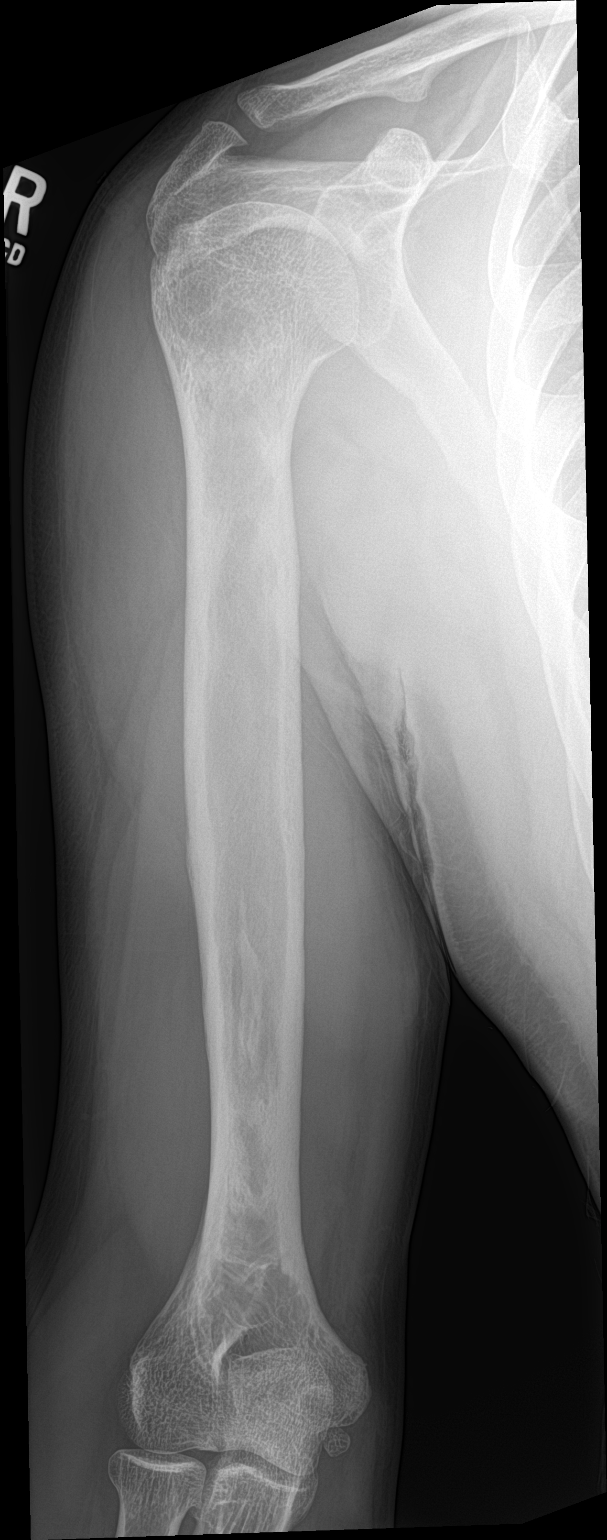

[humerus lat]
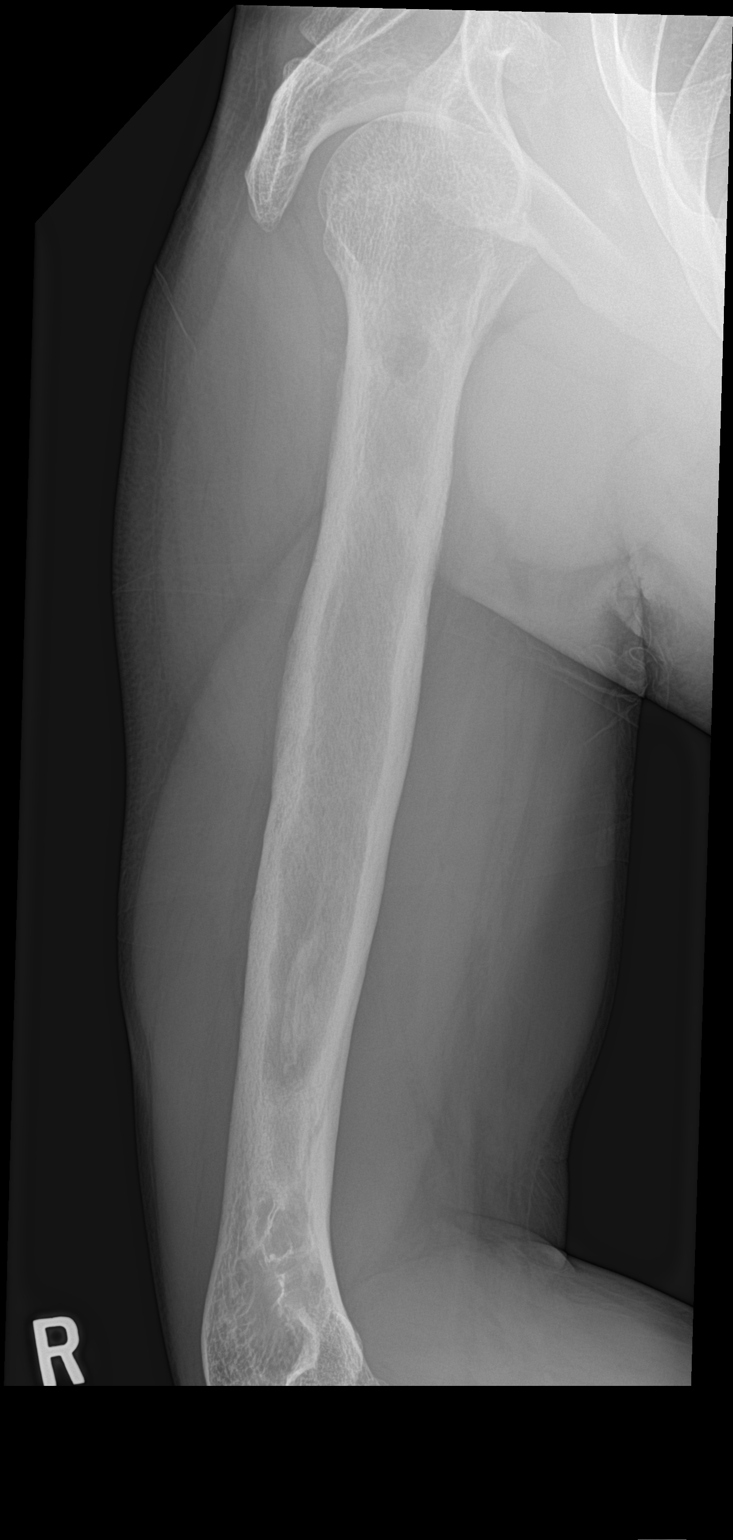

[2 of 2 positions shown; findings below may reference images not displayed]

FINDINGS: There is no evidence of fracture or other focal bone lesions. Soft
tissues are unremarkable. Previously noted diffuse cortical
thickening appears slightly improved from previous exam. No new bone
abnormalities are identified. No focal areas of cortical with
erosion or periosteal elevation noted.
IMPRESSION: 1. Stable to improved appearance of the right humerus with apparent
decrease in the cortical thickening noted on previous exam.

## 2017-11-17 ENCOUNTER — Other Ambulatory Visit: Payer: Self-pay

## 2017-11-17 ENCOUNTER — Ambulatory Visit (HOSPITAL_COMMUNITY)
Admission: EM | Admit: 2017-11-17 | Discharge: 2017-11-17 | Disposition: A | Discharge status: Home / Self Care | Payer: BLUE CROSS/BLUE SHIELD | Attending: Family Medicine | Admitting: Family Medicine

## 2017-11-17 ENCOUNTER — Encounter (HOSPITAL_COMMUNITY): Payer: Self-pay | Admitting: Emergency Medicine

## 2017-11-17 ENCOUNTER — Ambulatory Visit (INDEPENDENT_AMBULATORY_CARE_PROVIDER_SITE_OTHER): Payer: BLUE CROSS/BLUE SHIELD

## 2017-11-17 DIAGNOSIS — M79644 Pain in right finger(s): Secondary | ICD-10-CM

## 2017-11-17 NOTE — ED Provider Notes (Signed)
[place]    CSN: [id number] Arrival date & time: 11/17/17  6237     History   Chief Complaint Chief Complaint  Patient presents with  . Hand Pain    HPI Carl Odom is a 42 y.o. male.   Pt is a 41 year old male that presents with right index finger discomfort, mild swelling. This has been constant since Monday. He describes a catching sensation when flexing the finger. 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 . He has not taken anything for the pain. He did splint the finger over night and that helped slightly with the discomfort. He denies any specific injury, numbness, tingling or loss of sensation.   ROS per HPI      History reviewed. No pertinent past medical history.  Patient Active Problem List   Diagnosis Date Noted  . Osteomyelitis ([place]) 05/13/2015  . 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 right arm surgery    . wisdom teeth surgery         Home Medications    Prior to Admission medications   Not on File    Family History Family History  Problem Relation Age of Onset  . Healthy Mother   . Healthy Father     Social History Social History   Tobacco Use  . Smoking status: Never Smoker  . Smokeless tobacco: Never Used  Substance Use Topics  . Alcohol use: Yes  . Drug use: No     Allergies   Penicillin g   Review of Systems Review of Systems   Physical Exam Triage Vital Signs ED Triage Vitals  Enc Vitals Group     BP 11/17/17 0941 (!) 132/92     Pulse Rate 11/17/17 0941 82     Resp 11/17/17 0941 18     Temp 11/17/17 0941 98.5 F (36.9 C)     Temp Source 11/17/17 0941 Oral     SpO2 11/17/17 0941 100 %     Weight --      Height --      Head Circumference --      Peak Flow --      Pain Score 11/17/17 0938 2     Pain Loc --      Pain Edu? --      Excl. in [place]? --    No data found.  Updated Vital Signs BP (!) 132/92  (BP Location: Left Arm)   Pulse 82   Temp 98.5 F (36.9 C) (Oral)   Resp 18   SpO2 100%   Visual Acuity Right Eye Distance:   Left Eye Distance:   Bilateral Distance:    Right Eye Near:   Left Eye Near:    Bilateral Near:     Physical Exam  Constitutional: He appears well-developed and well-nourished.  HENT:  Head: Normocephalic and atraumatic.  Eyes: Conjunctivae are normal.  Neck: Normal range of motion.  Pulmonary/Chest: Effort normal.  Musculoskeletal: Normal range of motion. He exhibits tenderness. He exhibits no edema or deformity.  No obvious swelling to right index finger.  Limited flexion but patient able to extend finger.  No bruising or erythema  Neurological: He is alert.  Skin: Skin is warm and dry. No rash noted. No erythema. No pallor.  Psychiatric: He has a normal mood and affect.  Nursing note and vitals reviewed.    UC Treatments / Results  Labs (all labs ordered are listed, but only abnormal results are displayed) Labs Reviewed - No data to display  EKG None  Radiology Dg Finger Index Right  Result Date: 11/17/2017 CLINICAL DATA:  Right index finger pain and swelling. EXAM: RIGHT INDEX FINGER 2+V COMPARISON:  None. FINDINGS: Normal alignment in the right index finger. Negative for a fracture or dislocation. No significant joint space narrowing. Minimal spurring at the base of the distal phalanx. Evidence for soft tissue swelling. IMPRESSION: No acute bone abnormality. No significant arthropathy in the right index finger. Electronically Signed   By: Markus Daft M.D.   On: 11/17/2017 11:10    Procedures Procedures (including critical care time)  Medications Ordered in UC Medications - No data to display  Initial Impression / Assessment and Plan / UC Course  I have reviewed the triage vital signs and the nursing notes.  Pertinent labs & imaging results that were available during my care of the patient were reviewed by me and considered in my  medical decision making (see chart for details).    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 It could be early trigger finger.  We will apply splint to the area.  Rest the finger and try to avoid repeated movements, with grasping and prolonged use of the hand You can take ibuprofen for pain. No improvement in symptoms you may want to follow-up with a hand specialist    ED Prescriptions    None     Controlled Substance Prescriptions [place] Controlled Substance Registry consulted? Not Applicable   Orvan July, NP 11/19/17 [phone]

## 2017-11-17 NOTE — ED Triage Notes (Addendum)
Right index finger pain and swelling started on Monday.  Able to move finger, but has pain in finger regardless of moving finger or not.  No known injury  Patient is right handed.  Patient does flooring for work.

## 2017-11-17 NOTE — Discharge Instructions (Addendum)
I believe this is a problem with the flexor tendon of the finger. It could be early trigger finger.  We will apply splint to the area.  Rest the finger and try to avoid repeated movements, with grasping and prolonged use of the hand You can take ibuprofen for pain. No improvement in symptoms you may want to follow-up with a hand specialist

## 2019-08-03 ENCOUNTER — Inpatient Hospital Stay: Admit: 2019-08-03 | Discharge: 2019-08-03 | Payer: PRIVATE HEALTH INSURANCE

## 2019-08-03 ENCOUNTER — Emergency Department: Admit: 2019-08-03 | Payer: PRIVATE HEALTH INSURANCE | Primary: Internal Medicine

## 2019-08-03 ENCOUNTER — Encounter: Admit: 2019-08-03 | Payer: PRIVATE HEALTH INSURANCE | Primary: Internal Medicine

## 2019-08-03 DIAGNOSIS — I10 Essential (primary) hypertension: Secondary | ICD-10-CM

## 2019-08-03 MED ORDER — PREDNISONE 20 MG TABLET
20 mg | ORAL_TABLET | Freq: Three times a day (TID) | ORAL | 1 refills | Status: AC
Start: 2019-08-03 — End: ?

## 2019-08-03 MED ORDER — COLCHICINE 0.6 MG TABLET
0.6 mg | Freq: Every day | ORAL | Status: DC
Start: 2019-08-03 — End: 2019-08-04
  Administered 2019-08-04: 02:00:00 0.6 mg via ORAL

## 2019-08-03 MED ORDER — LABETALOL 200 MG TABLET
200 mg | Freq: Once | ORAL | Status: CP
Start: 2019-08-03 — End: ?
  Administered 2019-08-03: 22:00:00 200 mg via ORAL

## 2019-08-03 MED ORDER — IBUPROFEN 600 MG TABLET
600 mg | Freq: Once | ORAL | Status: CP
Start: 2019-08-03 — End: ?
  Administered 2019-08-03: 22:00:00 600 mg via ORAL

## 2019-08-03 MED ORDER — METOPROLOL SUCCINATE ER 25 MG TABLET,EXTENDED RELEASE 24 HR
25 mg | ORAL_TABLET | Freq: Every day | ORAL | 1 refills | Status: AC
Start: 2019-08-03 — End: 2020-08-28

## 2019-08-03 MED ORDER — LABETALOL 5 MG/ML INTRAVENOUS SOLUTION
5 mg/mL | Freq: Once | INTRAVENOUS | Status: CP
Start: 2019-08-03 — End: ?
  Administered 2019-08-04: 03:00:00 5 mL via INTRAVENOUS

## 2019-08-03 MED ORDER — AMLODIPINE 10 MG TABLET
10 mg | Freq: Once | ORAL | Status: CP
Start: 2019-08-03 — End: ?
  Administered 2019-08-04: 03:00:00 10 mg via ORAL

## 2019-08-03 MED ORDER — AMLODIPINE 10 MG TABLET
10 mg | Status: CP
Start: 2019-08-03 — End: ?
  Administered 2019-08-04: 03:00:00 10 mg via ORAL

## 2019-08-03 MED ORDER — COLCHICINE 0.6 MG TABLET
0.6 mg | Freq: Every day | ORAL | Status: DC
Start: 2019-08-03 — End: 2019-08-04

## 2019-08-03 MED ORDER — LOSARTAN 100 MG TABLET
100 mg | ORAL_TABLET | Freq: Every day | ORAL | 2 refills | Status: AC
Start: 2019-08-03 — End: ?

## 2019-08-03 MED ORDER — AMLODIPINE 5 MG TABLET
5 mg | ORAL_TABLET | Freq: Every day | ORAL | 2 refills | Status: AC
Start: 2019-08-03 — End: 2020-07-16

## 2019-08-03 MED ORDER — KETOROLAC 30 MG/ML (1 ML) INJECTION SOLUTION
301 mg/mL (1 mL) | Freq: Once | INTRAVENOUS | Status: CP
Start: 2019-08-03 — End: ?
  Administered 2019-08-04: 02:00:00 30 mL via INTRAVENOUS

## 2019-08-03 MED ORDER — PREDNISONE 20 MG TABLET
20 mg | Freq: Once | ORAL | Status: CP
Start: 2019-08-03 — End: ?
  Administered 2019-08-04: 02:00:00 20 mg via ORAL

## 2019-08-03 MED ORDER — LABETALOL 5 MG/ML INTRAVENOUS SOLUTION
5 mg/mL | Freq: Once | INTRAVENOUS | Status: CP
Start: 2019-08-03 — End: ?
  Administered 2019-08-04: 01:00:00 5 mL via INTRAVENOUS

## 2019-08-03 MED ORDER — LABETALOL 5 MG/ML INTRAVENOUS SOLUTION
5 mg/mL | Freq: Once | INTRAVENOUS | Status: CP
Start: 2019-08-03 — End: ?
  Administered 2019-08-04: 02:00:00 5 mL via INTRAVENOUS

## 2019-08-03 NOTE — ED Triage Note
6:09 PMPt was seen by me in ED triage. Pt will need further medical care. Initial studies ordered pending re-evaluation by the next ED medical provider.Ho untreated htn (previously on toprol xl 25 but stopped a few months ago). Denies any ha, chest pain, sob, abd/flank pain, neuro symptoms. Co L thumb pain. L handed.Riley Lam, MD RDMS FACEPAttending PhysicianDepartment of Emergency Medicine

## 2019-08-04 DIAGNOSIS — Z79899 Other long term (current) drug therapy: Secondary | ICD-10-CM

## 2019-08-04 DIAGNOSIS — M109 Gout, unspecified: Secondary | ICD-10-CM

## 2019-08-04 DIAGNOSIS — F17210 Nicotine dependence, cigarettes, uncomplicated: Secondary | ICD-10-CM

## 2019-08-04 LAB — CBC WITH AUTO DIFFERENTIAL
BKR WAM ABSOLUTE IMMATURE GRANULOCYTES: 0 x 1000/??L (ref 0.0–0.4)
BKR WAM ABSOLUTE LYMPHOCYTE COUNT: 2.5 x 1000/??L (ref 0.5–5.4)
BKR WAM ABSOLUTE NRBC: 0 x 1000/??L
BKR WAM ANALYZER ANC: 8.4 x 1000/??L — ABNORMAL HIGH (ref 2.2–7.2)
BKR WAM BASOPHIL ABSOLUTE COUNT: 0.1 x 1000/??L (ref 0.0–0.2)
BKR WAM BASOPHILS: 0.6 % (ref 0.0–2.0)
BKR WAM EOSINOPHIL ABSOLUTE COUNT: 0.4 x 1000/??L (ref 0.0–0.4)
BKR WAM EOSINOPHILS: 2.9 % (ref 0.0–4.0)
BKR WAM HEMOGLOBIN: 14.5 g/dL (ref 13.9–16.3)
BKR WAM IMMATURE GRANULOCYTES: 0.3 % (ref 0.0–0.4)
BKR WAM LYMPHOCYTES: 20.2 % (ref 10.0–50.0)
BKR WAM MCH (PG): 30 pg (ref 25.0–35.0)
BKR WAM MCHC: 33.6 g/dL (ref 33.0–37.0)
BKR WAM MCV: 89 fL (ref 80.0–94.0)
BKR WAM MONOCYTE ABSOLUTE COUNT: 1 x 1000/??L (ref 0.1–1.2)
BKR WAM MONOCYTES: 7.8 % (ref 3.0–11.0)
BKR WAM MPV: 12 fL (ref 8.0–12.0)
BKR WAM NEUTROPHILS: 68.2 % (ref 45.0–90.0)
BKR WAM NUCLEATED RED BLOOD CELLS: 0 % (ref 0.0–0.0)
BKR WAM PLATELETS: 276 x1000/ÂµL (ref 120–450)
BKR WAM RDW-CV: 12.8 % (ref 11.5–14.5)
BKR WAM RED BLOOD CELL COUNT: 4.8 M/??L (ref 4.3–5.9)
BKR WAM WHITE BLOOD CELL COUNT: 12.3 x1000/??L — ABNORMAL HIGH (ref 4.8–10.8)

## 2019-08-04 LAB — BASIC METABOLIC PANEL
BKR ANION GAP: 13 % (ref 7–17)
BKR BLOOD UREA NITROGEN: 15 mg/dL (ref 6–20)
BKR BUN / CREAT RATIO: 16.5 % (ref 8.0–23.0)
BKR CALCIUM: 10 mg/dL (ref 8.8–10.2)
BKR CHLORIDE: 104 mmol/L (ref 98–107)
BKR CO2: 24 mmol/L (ref 20–30)
BKR CREATININE: 0.91 mg/dL (ref 0.40–1.30)
BKR EGFR (AFR AMER): >60 mL/min/{1.73_m2} (ref >60)
BKR EGFR (NON AFRICAN AMERICAN): >60 mL/min/{1.73_m2} (ref >60)
BKR GLUCOSE: 98 mg/dL (ref 70–100)
BKR POTASSIUM: 3.8 mmol/L (ref 3.3–5.1)
BKR SODIUM: 141 mmol/L (ref 136–144)
BKR WAM HEMATOCRIT: 3.8 mmol/L (ref 3.3–5.1)

## 2019-08-04 NOTE — ED Notes
8:14 PM 44 y/o male sent to ED from Clinic today. Pt reports went to clinic c/o left hand pain- denies any injury/trauma to area. States had his vitals taken at clinic and found Hypertensive (hx of: non-compliant with home medications; states has not taken in >6 months) and was sent to ED for further workup. Pt hypertensive on arrival to ED 214/134- po 200mg  Labetalol @ 18:12 for HTN; also received po motrin for pain. Repeat BP 198/122. Pt denies any symptoms; no headache/dizziness/chest pain. Pt A&Ox4, speaking in full, clear sentences. xrays completed of left hand. EKG completed at bedside. Pt changed into hospital gown; placed on continuous cardiac monitoring. IV access established; blood work sent as ordered. Pt provided with call bell; offers no further complaints. wctm10:35 PMPt medicated per eMAR; tolerated well. MD [place] at bedside for pt re-evaluation and update.11:27 PMAdditional medications administered per eMAR; pending discharge plan.11:46 PMPt BP 168/87- cleared for discharge by MD. Provider at bedside for discharge instructions/prescription medications. IV access removed- catheter intact & bleeding controlled. Pt ambulatory out of ED with strong, steady gait in NAD.

## 2019-08-04 NOTE — Discharge Instructions
Thank you for allowing [place] to take care of you. We hope that you feel better soon. You came into the emergency room because hypertension and gout.While you were here, we did a thorough exam and tests which showed a normal x-ray of your finger and no signs of hypertensive emergency.Please remember to take your blood pressure medication as prescribed and follow up with your PCP.A copy of your tests is provided with your discharge papers. If you have any problems with your prescriptions, or any problems after discharge, there is a nurse available Monday through Friday from 7:00am-3:30pm at [phone] to help answer any questions. You can also call the Emergency Department directly at [phone].Please follow up with your primary care doctor on the next business day.If you develop fever not controlled by tylenol/ibuprofen, confusion or strange behavior, new/increased pain, difficulty breathing, uncontrollable vomiting, or any other new or concerning symptoms, please return to the emergency department immediately for further evaluation.  We are always open.

## 2019-08-13 NOTE — ED Provider Notes
RESIDENT NOTE AND MDM PRESENTATION:  Pt is a 44 y.o. male presenting for HTN. Pt was seen at a clinic today where BP was high so they sent him to the ED. He says he stopped taking his BP meds 6 months ago for no particular reason. He went to the clinic for L thumb pain onsert this morning which he said is similar to his past gout pain. Pt denies any CP, SOB, HA, weakness, palpitations, or any other Sx at this time.- remaining review of systems and physical exam as detailed belowVital Signs: Vital signs at exam: BP (!) 168/87  - Pulse 69  - Temp 98.3 ?F (36.8 ?C)  - Resp 18  - Wt 86.7 kg (191 lb 2.2 oz)  - SpO2 96% DDX: HypertensionGoutMDM/COURSE:Labetalol to lower BPDISPODischargeThis patient was presented to and discussed with Dr. [place] and a treatment plan and disposition were collaboratively agreed upon.Sharrie Rothman, M.D.Emergency MedicinePGY-1---------------------------------------------------------------------------------------------------------------------HistoryChief Complaint Patient presents with ? Hypertension   sent in from doctors office for elevated blood pressure, Has history of same but has not been taking his medications  The history is provided by the patient. IllnessThis is a new problem. The current episode started less than 1 hour ago. The problem occurs constantly. The problem has not changed since onset.Pertinent negatives include no chest pain, no abdominal pain, no headaches and no shortness of breath.  Past Medical History: Diagnosis Date ? Hypertension  No past surgical history on file.No family history on file.Social History Socioeconomic History ? Marital status: Single   Spouse name: Not on file ? Number of children: Not on file ? Years of education: Not on file ? Highest education level: Not on file Tobacco Use ? Smoking status: Current Every Day Smoker   Packs/day: 0.50   Types: Cigarettes ? Smokeless tobacco: Never Used Substance and Sexual Activity ? Alcohol use: Yes   Frequency: 2-3 times a week   Comment: occassional ? Drug use: No ED Other Social History ? Amphetamine frequency Never used Never used on 02/22/2018 ? Cannabis frequency Never used Never used on 02/22/2018 ? Cocaine frequency Never used Never used on 02/22/2018 E-cigarette/Vaping Substances E-cigarette/Vaping Devices Review of Systems Constitutional: Negative for chills and fever. Respiratory: Negative for shortness of breath.  Cardiovascular: Negative for chest pain. Gastrointestinal: Negative for abdominal pain, diarrhea, nausea and vomiting. Neurological: Negative for weakness and headaches. All other systems reviewed and are negative. Physical ExamED Triage VitalsBP: (!) 214/134 [08/03/19 1805]Pulse: (!) 92 [08/03/19 1805]Pulse from  O2 sat: 79 [08/03/19 2008]Resp: 17 [08/03/19 1805]Temp: 98.3 ?F (36.8 ?C) [08/03/19 1805]Temp src: n/aSpO2: 100 % [08/03/19 1805] BP (!) 168/87  - Pulse 69  - Temp 98.3 ?F (36.8 ?C)  - Resp 18  - Wt 86.7 kg (191 lb 2.2 oz)  - SpO2 96% Physical ExamVitals signs reviewed. HENT:    Head: Normocephalic. Eyes:    Extraocular Movements: Extraocular movements intact. Cardiovascular:    Rate and Rhythm: Normal rate and regular rhythm. Pulmonary:    Effort: Pulmonary effort is normal.    Breath sounds: Normal breath sounds. Abdominal:    General: Abdomen is flat. Bowel sounds are normal. There is no distension.    Palpations: Abdomen is soft.    Tenderness: There is no abdominal tenderness. Skin:   General: Skin is warm. Neurological:    General: No focal deficit present.    Mental Status: He is alert. Psychiatric:       Mood and Affect: Mood normal.       Behavior: Behavior  normal.  ProceduresProcedures ED COURSEPatient Reevaluation: Labs ReviewedCBC WITH AUTO DIFFERENTIAL - Abnormal; Notable for the following components:   WBC                           12.3 (*)             ANC (Abs Neutrophil Count)    8.4 (*)           All other components within normal limitsBASIC METABOLIC PANELCBC AND DIFFERENTIAL     Narrative: The following orders were created for panel order CBC w/diff.                Procedure                               Abnormality         Status                                   ---------                               -----------         ------                                   CBC auto differential([id number])        Abnormal            Final result                                             Please view results for these tests on the individual orders.BASIC METABOLIC PANELXR Finger Left Final Result      Negative study.     Reported and signed by:  Karyl Kinnier, MD Patient progress: improvedComments as of Aug 13 1598 Fri Aug 03, 2019 2306 Pt BP improved, now 175/98. Discharge.  [NO]  Comments User Index[NO] Laurene Footman, MD   Clinical Impressions as of Aug 13 1598 Hypertension, unspecified type Gout of left hand, unspecified cause, unspecified chronicity  ED DispositionDischarge Charlynn Grimes, MDResident08/04/21 1315I performed a history and physical examination of Garlan Drewes and discussed his management with Dr. Lockie Pares.  I agree with the history, physical, assessment, and plan of care, with the following exceptions: NoneI was present for the following procedures: NoneTime Spent in Critical Care of the patient: NoneTime spent in discussions with the patient and family:25B Crystalina Stodghill [place], DO [place], Keosha Rossa Bernard, DO08/09/21 1600

## 2019-12-24 IMAGING — DX DG FINGER INDEX 2+V*R*
3 series · 3 of 3 positions shown · non-contrast
Comparison: None.

CLINICAL DATA: Right index finger pain and swelling.

EXAM:
RIGHT INDEX FINGER 2+V

[finger ap]
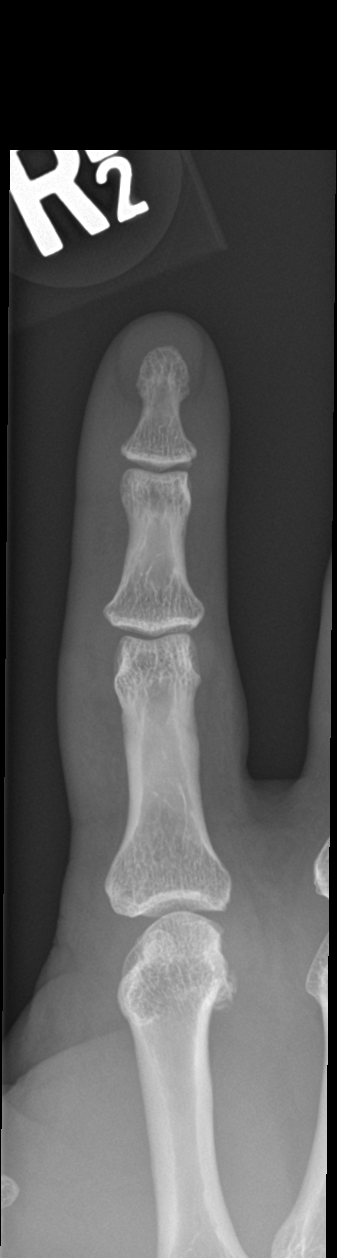

[finger obl]
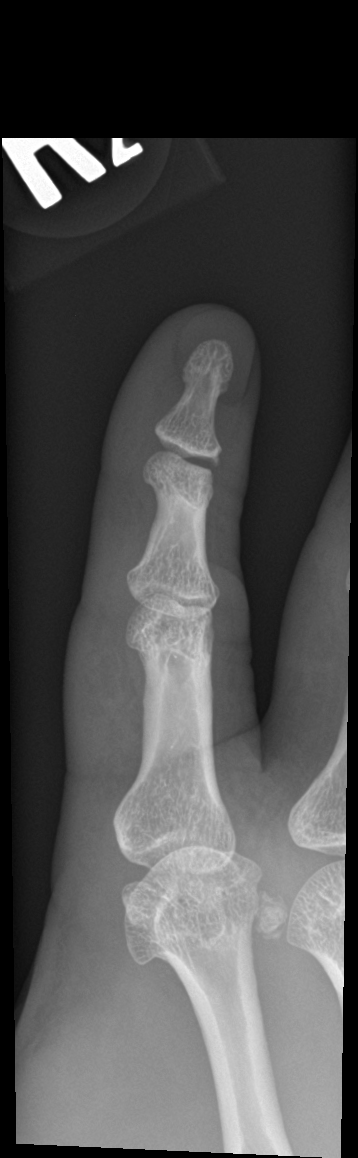

[finger lat]
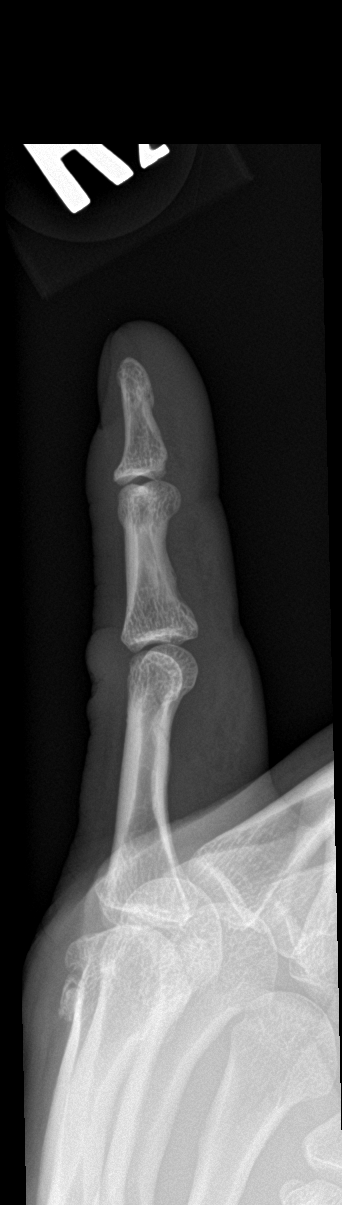

[3 of 3 positions shown; findings below may reference images not displayed]

FINDINGS: Normal alignment in the right index finger. Negative for a fracture
or dislocation. No significant joint space narrowing. Minimal
spurring at the base of the distal phalanx. Evidence for soft tissue
swelling.
IMPRESSION: No acute bone abnormality. No significant arthropathy in the right
index finger.

## 2023-05-31 ENCOUNTER — Emergency Department: Admit: 2023-05-31 | Payer: BLUE CROSS/BLUE SHIELD | Primary: Internal Medicine

## 2023-05-31 ENCOUNTER — Inpatient Hospital Stay
Admit: 2023-05-31 | Discharge: 2023-06-02 | Payer: BLUE CROSS/BLUE SHIELD | Attending: Internal Medicine | Admitting: Internal Medicine

## 2023-05-31 DIAGNOSIS — K859 Acute pancreatitis without necrosis or infection, unspecified: Secondary | ICD-10-CM

## 2023-05-31 LAB — CBC WITH AUTO DIFFERENTIAL
BKR WAM ABSOLUTE IMMATURE GRANULOCYTES.: 0.05 x 1000/ÂµL (ref 0.00–0.30)
BKR WAM ABSOLUTE LYMPHOCYTE COUNT.: 2.5 x 1000/ÂµL (ref 0.60–3.70)
BKR WAM ABSOLUTE NRBC: 0 x 1000/ÂµL (ref 0.00–1.00)
BKR WAM ANC (ABSOLUTE NEUTROPHIL COUNT): 10.19 x 1000/ÂµL — ABNORMAL HIGH (ref 2.00–7.60)
BKR WAM BASOPHIL ABSOLUTE COUNT.: 0.09 x 1000/ÂµL (ref 0.00–1.00)
BKR WAM BASOPHILS: 0.6 % (ref 0.0–1.4)
BKR WAM EOSINOPHIL ABSOLUTE COUNT.: 0.31 x 1000/ÂµL (ref 0.00–1.00)
BKR WAM EOSINOPHILS: 2.1 % (ref 0.0–5.0)
BKR WAM HEMATOCRIT: 44.2 % (ref 38.50–50.00)
BKR WAM HEMOGLOBIN: 15.2 g/dL (ref 13.2–17.1)
BKR WAM IMMATURE GRANULOCYTES: 0.3 % (ref 0.0–1.0)
BKR WAM LYMPHOCYTES: 17.3 % (ref 17.0–50.0)
BKR WAM MCH: 30.7 pg (ref 27.0–33.0)
BKR WAM MCHC: 34.4 g/dL (ref 31.0–36.0)
BKR WAM MCV: 89.3 fL (ref 80.0–100.0)
BKR WAM MONOCYTE ABSOLUTE COUNT.: 1.33 x 1000/ÂµL — ABNORMAL HIGH (ref 0.00–1.00)
BKR WAM MONOCYTES: 9.2 % (ref 4.0–12.0)
BKR WAM MPV: 12.7 fL — ABNORMAL HIGH (ref 8.0–12.0)
BKR WAM NEUTROPHILS: 70.5 % (ref 39.0–72.0)
BKR WAM NUCLEATED RED BLOOD CELLS: 0 % (ref 0.0–1.0)
BKR WAM PLATELETS: 258 x1000/ÂµL (ref 150–420)
BKR WAM RDW-CV: 13.2 % (ref 11.0–15.0)
BKR WAM RED BLOOD CELL COUNT.: 4.95 M/ÂµL (ref 4.00–6.00)
BKR WAM WHITE BLOOD CELL COUNT: 14.5 x1000/ÂµL — ABNORMAL HIGH (ref 4.0–11.0)

## 2023-05-31 LAB — BASIC METABOLIC PANEL
BKR ANION GAP: 15 (ref 7–17)
BKR BLOOD UREA NITROGEN: 17 mg/dL (ref 6–20)
BKR BUN / CREAT RATIO: 14.3 (ref 8.0–23.0)
BKR CALCIUM: 9.5 mg/dL (ref 8.8–10.2)
BKR CHLORIDE: 101 mmol/L (ref 98–107)
BKR CO2: 22 mmol/L (ref 20–30)
BKR CREATININE: 1.19 mg/dL (ref 0.40–1.30)
BKR EGFR, CREATININE (CKD-EPI 2021): >60 mL/min/{1.73_m2} (ref >=60)
BKR GLUCOSE: 106 mg/dL — ABNORMAL HIGH (ref 70–100)
BKR POTASSIUM: 3.9 mmol/L (ref 3.3–5.3)
BKR SODIUM: 138 mmol/L (ref 136–144)

## 2023-05-31 LAB — HEPATIC FUNCTION PANEL
BKR A/G RATIO: 1.3 (ref 1.0–2.2)
BKR ALANINE AMINOTRANSFERASE (ALT): 56 U/L (ref 9–59)
BKR ALBUMIN: 4.4 g/dL (ref 3.6–5.1)
BKR ALKALINE PHOSPHATASE: 106 U/L (ref 9–122)
BKR ASPARTATE AMINOTRANSFERASE (AST): 32 U/L (ref 10–35)
BKR AST/ALT RATIO: 0.6
BKR BILIRUBIN DIRECT: 0.1 mg/dL (ref <=0.2)
BKR BILIRUBIN TOTAL: 0.4 mg/dL (ref <=1.2)
BKR GLOBULIN: 3.5 g/dL (ref 2.0–3.9)
BKR PROTEIN TOTAL: 7.9 g/dL (ref 5.9–8.3)

## 2023-05-31 LAB — LIPASE: BKR LIPASE: 88 U/L — ABNORMAL HIGH (ref 11–55)

## 2023-05-31 MED ORDER — KETOROLAC 30 MG/ML (1 ML) INJECTION SOLUTION
30 | Freq: Once | INTRAMUSCULAR | Status: CP
Start: 2023-05-31 — End: ?
  Administered 2023-05-31: 18:00:00 30 mL via INTRAMUSCULAR

## 2023-05-31 MED ORDER — ALUMINUM-MAG HYDROXIDE-SIMETHICONE 200 MG-200 MG-20 MG/5 ML ORAL SUSP
200-200-20 | Freq: Once | ORAL | Status: CP
Start: 2023-05-31 — End: ?
  Administered 2023-05-31: 18:00:00 200-200-20 mL via ORAL

## 2023-05-31 MED ORDER — ONDANSETRON 4 MG DISINTEGRATING TABLET
4 | Freq: Once | ORAL | Status: CP
Start: 2023-05-31 — End: ?
  Administered 2023-05-31: 18:00:00 4 mg via ORAL

## 2023-05-31 NOTE — ED Triage Note
 I briefly evaluated this patient in triage. I was concerned about upper abd pain today, primarily epigastric/RUQ and placed orders as appropriate. I will defer further workup and management to the primary ED team.

## 2023-06-01 ENCOUNTER — Encounter: Admit: 2023-06-01 | Payer: PRIVATE HEALTH INSURANCE | Primary: Internal Medicine

## 2023-06-01 DIAGNOSIS — I10 Essential (primary) hypertension: Secondary | ICD-10-CM

## 2023-06-01 LAB — CBC WITH AUTO DIFFERENTIAL
BKR WAM ABSOLUTE IMMATURE GRANULOCYTES.: 0.04 x 1000/ÂµL (ref 0.00–0.30)
BKR WAM ABSOLUTE LYMPHOCYTE COUNT.: 2.36 x 1000/ÂµL (ref 0.60–3.70)
BKR WAM ABSOLUTE NRBC: 0 x 1000/ÂµL (ref 0.00–1.00)
BKR WAM ANC (ABSOLUTE NEUTROPHIL COUNT): 7.99 x 1000/ÂµL — ABNORMAL HIGH (ref 2.00–7.60)
BKR WAM BASOPHIL ABSOLUTE COUNT.: 0.08 x 1000/ÂµL (ref 0.00–1.00)
BKR WAM BASOPHILS: 0.7 % (ref 0.0–1.4)
BKR WAM EOSINOPHIL ABSOLUTE COUNT.: 0.25 x 1000/ÂµL (ref 0.00–1.00)
BKR WAM EOSINOPHILS: 2.1 % (ref 0.0–5.0)
BKR WAM HEMATOCRIT: 43.8 % (ref 38.50–50.00)
BKR WAM HEMOGLOBIN: 15.1 g/dL (ref 13.2–17.1)
BKR WAM IMMATURE GRANULOCYTES: 0.3 % (ref 0.0–1.0)
BKR WAM LYMPHOCYTES: 20.1 % (ref 17.0–50.0)
BKR WAM MCH: 30.9 pg (ref 27.0–33.0)
BKR WAM MCHC: 34.5 g/dL (ref 31.0–36.0)
BKR WAM MCV: 89.6 fL (ref 80.0–100.0)
BKR WAM MONOCYTE ABSOLUTE COUNT.: 1.01 x 1000/ÂµL — ABNORMAL HIGH (ref 0.00–1.00)
BKR WAM MONOCYTES: 8.6 % (ref 4.0–12.0)
BKR WAM MPV: 12.9 fL — ABNORMAL HIGH (ref 8.0–12.0)
BKR WAM NEUTROPHILS: 68.2 % (ref 39.0–72.0)
BKR WAM NUCLEATED RED BLOOD CELLS: 0 % (ref 0.0–1.0)
BKR WAM PLATELETS: 250 x1000/ÂµL (ref 150–420)
BKR WAM RDW-CV: 12.9 % (ref 11.0–15.0)
BKR WAM RED BLOOD CELL COUNT.: 4.89 M/ÂµL (ref 4.00–6.00)
BKR WAM WHITE BLOOD CELL COUNT: 11.7 x1000/ÂµL — ABNORMAL HIGH (ref 4.0–11.0)

## 2023-06-01 LAB — BASIC METABOLIC PANEL
BKR ANION GAP: 11 (ref 7–17)
BKR BLOOD UREA NITROGEN: 14 mg/dL (ref 6–20)
BKR BUN / CREAT RATIO: 13.9 (ref 8.0–23.0)
BKR CALCIUM: 9.3 mg/dL (ref 8.8–10.2)
BKR CHLORIDE: 104 mmol/L (ref 98–107)
BKR CO2: 24 mmol/L (ref 20–30)
BKR CREATININE DELTA: -0.18
BKR CREATININE: 1.01 mg/dL (ref 0.40–1.30)
BKR EGFR, CREATININE (CKD-EPI 2021): >60 mL/min/{1.73_m2} (ref >=60)
BKR GLUCOSE: 108 mg/dL — ABNORMAL HIGH (ref 70–100)
BKR POTASSIUM: 4 mmol/L (ref 3.3–5.3)
BKR SODIUM: 139 mmol/L (ref 136–144)

## 2023-06-01 MED ORDER — SODIUM CHLORIDE 0.45 % INTRAVENOUS SOLUTION
0.45 | INTRAVENOUS | Status: AC
Start: 2023-06-01 — End: ?
  Administered 2023-06-01: 06:00:00 0.45 mL/h via INTRAVENOUS

## 2023-06-01 MED ORDER — SODIUM CHLORIDE 0.9 % IV BOLUS NEW BAG (DROPS CHARGE)
0.9 | Freq: Once | INTRAVENOUS | Status: CP
Start: 2023-06-01 — End: ?
  Administered 2023-06-01: 01:00:00 0.9 mL/h via INTRAVENOUS

## 2023-06-01 MED ORDER — DIAZEPAM 5 MG TABLET
5 | ORAL | Status: DC | PRN
Start: 2023-06-01 — End: 2023-06-02

## 2023-06-01 MED ORDER — NIFEDIPINE ER 30 MG TABLET,EXTENDED RELEASE 24 HR
30 | Freq: Every day | ORAL | Status: DC
Start: 2023-06-01 — End: 2023-06-02
  Administered 2023-06-01 – 2023-06-02 (×2): 30 mg via ORAL

## 2023-06-01 MED ORDER — THIAMINE HCL (VITAMIN B1) 100 MG TABLET
100 | Freq: Every day | ORAL | Status: DC
Start: 2023-06-01 — End: 2023-06-02
  Administered 2023-06-01 – 2023-06-02 (×2): 100 mg via ORAL

## 2023-06-01 MED ORDER — NICOTINE 7 MG/24 HR DAILY TRANSDERMAL PATCH
7 | Freq: Every day | TRANSDERMAL | Status: DC
Start: 2023-06-01 — End: 2023-06-02

## 2023-06-01 MED ORDER — METOPROLOL TARTRATE IMMEDIATE RELEASE 25 MG TABLET
25 | Freq: Two times a day (BID) | ORAL | Status: DC
Start: 2023-06-01 — End: 2023-06-02
  Administered 2023-06-01 – 2023-06-02 (×3): 25 mg via ORAL

## 2023-06-01 MED ORDER — FOLIC ACID 1 MG TABLET
1 | Freq: Every day | ORAL | Status: DC
Start: 2023-06-01 — End: 2023-06-02
  Administered 2023-06-01 – 2023-06-02 (×2): 1 mg via ORAL

## 2023-06-01 MED ORDER — NIFEDIPINE ER 60 MG TABLET,EXTENDED RELEASE 24 HR
60 | Freq: Every day | ORAL | 4.00 refills | 90.00 days | Status: AC
Start: 2023-06-01 — End: ?

## 2023-06-01 MED ORDER — SODIUM CHLORIDE 0.9 % (FLUSH) INJECTION SYRINGE
0.9 | INTRAVENOUS | Status: DC | PRN
Start: 2023-06-01 — End: 2023-06-02

## 2023-06-01 MED ORDER — LOSARTAN 25 MG TABLET
25 | Freq: Every day | ORAL | Status: DC
Start: 2023-06-01 — End: 2023-06-02
  Administered 2023-06-01 – 2023-06-02 (×2): 25 mg via ORAL

## 2023-06-01 MED ORDER — SODIUM CHLORIDE 0.9 % (FLUSH) INJECTION SYRINGE
0.9 | Freq: Three times a day (TID) | INTRAVENOUS | Status: DC
Start: 2023-06-01 — End: 2023-06-02
  Administered 2023-06-01 – 2023-06-02 (×2): 0.9 mL via INTRAVENOUS

## 2023-06-01 MED ORDER — MELATONIN 3 MG TABLET
3 | Freq: Every evening | ORAL | Status: DC | PRN
Start: 2023-06-01 — End: 2023-06-02

## 2023-06-01 MED ORDER — FAMOTIDINE 20 MG TABLET
20 | Freq: Once | ORAL | Status: CP
Start: 2023-06-01 — End: ?
  Administered 2023-06-01: 01:00:00 20 mg via ORAL

## 2023-06-01 MED ORDER — MORPHINE 2 MG/ML INJECTION SYRINGE
2 | SUBCUTANEOUS | Status: DC | PRN
Start: 2023-06-01 — End: 2023-06-02

## 2023-06-01 NOTE — ED Provider Notes
 Chief Complaint Patient presents with  Abdominal Pain   Pt reports rt and mid upper abdominal pain which radiates through to his mid back today, denies associated vomiting, diarrhea, CP or fevers  HPI:48 year old male with a past medical history of hypertension is presenting today with epigastric abdominal pain since 10:30 a.m.Aaron Aas  Patient states it is epigastric abdominal pain radiates to the midthoracic region of his back.  Patient denies any nausea, vomiting, diarrhea, chest pain, shortness of breath, or urinary symptoms.  He has not taken any medications for his pain at home.  Patient states that his pain is now a 5/10 after receiving medication here in the emergency department.  Patient denies any recent heavy lifting, motor vehicle accidents, or traumas.  The patient's significant other states that he has been seen here in the emergency department for his pancreas in the past.  He does not have a gastroenterologist.An acute or life threatening problem was considered during this evaluation   Patient verbalized permission to discuss medical treatment, results, plans, discharge instructions with his family present.Vitals reviewed.CBC, BMP, lipase, and hepatic function panel reviewed.Right upper quadrant ultrasound reviewed.- FINDINGS: LIVER: Echotexture: Diffusely echogenic. Focal lesions: None. Intrahepatic biliary ductal dilatation: None.BILIARY TREE: Common duct measures 0.3 cm. GALLBLADDER: Unremarkable. Sonographic Abigail Abler sign is negative. PANCREAS: Visualized portions are unremarkable. RIGHT KIDNEY: Size: 12.2 cm in maximal sagittal dimension.  Echogenicity: Normal. Hydronephrosis: Not present. Calculi: None visualized. Focal lesions: None. UPPER ABDOMINAL AORTA: Measures 2.2 cm. UPPER ABDOMINAL IVC: Patent.ADDITIONAL FINDINGS:  None IMPRESSION: No evidence of cholelithiasis or acute cholecystitis. Echogenic liver, which may be seen with hepatic steatosis or other hepatic parenchymal diseases.[facility] Notify System Classification: Routine.Reported and signed by: Belvie Boyers, MDCT renal stone without IV contrast no oral contrast reviewed.- FINDINGS:KIDNEYS & URETERS: The kidneys are normal, without hydronephrosis or calculi. No ureteral calcifications.URINARY BLADDER: Unremarkable.LOWER CHEST: Unremarkable.LIVER: Hepatic steatosis.GALLBLADDER: Unremarkable.  SPLEEN: Unremarkable.PANCREAS: Pancreas is normal in size. No ductal dilatation. No discrete mass. Mild peripancreatic fat stranding surrounding the uncinate process and the third part of duodenum, unclear if this is from duodenitis versus pancreatitis.ADRENALS: A tiny 6 mm left adrenal adenoma. Mild thickening of the adrenal glands.BOWEL: No bowel obstruction. Mild wall thickening of the third portion of the duodenum with surrounding fat stranding, extending around the uncinate process of the pancreas.APPENDIX: Unremarkable.  PERITONEUM: No ascites or intraperitoneal free air.LYMPH NODES: No abnormal or enlarged lymph nodes are seen.VESSELS: Unremarkable. PELVIS: Unremarkable.BONES & SOFT TISSUE: No aggressive osseous lesion. No acute osseous abnormality. Transitional lumbosacral junction anatomy. There are degenerative changes in the lumbar spine, most prominently at L4-5 with mild disc space narrowing, Schmorl's nodes, endplate sclerosis and posterior disc osteophyte complex. Small fat-containing umbilical hernia. IMPRESSION:No hydronephrosis or calculi Mild fat stranding surrounding the uncinate process of pancreas and the third part of duodenum, unclear if this is from duodenitis versus pancreatitis. Correlate clinically.[facility] Notify System Classification: Routine.Reported and signed by: Licia Reek, [phone]:  Spoke with the hospitalist.  Patient will be admitted under Dr. Afrin for further management and appropriate disposition.DDX:  Including but not limited to:  GERD, cholelithiasis, cholecystitis, choledocholithiasis, biliary colic, electrolyte abnormality, nephrolithiasis, pyelonephritis, musculoskeletal injury, pancreatitisPlan:  Admit to hospitalist under Dr. Afrin for further symptomatic management, evaluation, and appropriate disposition.Portions of this note may have been prepared using dictation software, I have reviewed the text for grammatical errors, or transcription errors. Please excuse any mistakes if found, and please feel free to contact me for clarification.  Physical ExamED Triage VitalsBP: (!) 173/128 [05/31/23  1754]Pulse: (!) 110 [05/31/23 1754]Pulse from  O2 sat: n/aResp: 16 [05/31/23 1754]Temp: 97.7 ?F (36.5 ?C) [05/31/23 1754]Temp src: Oral [05/31/23 2230]SpO2: 99 % [05/31/23 1754] BP 132/87  - Pulse 71  - Temp 97.9 ?F (36.6 ?C) (Oral)  - Resp 16  - Wt 91.6 kg (201 lb 15.1 oz)  - SpO2 99%  - BMI 32.59 kg/m? Physical ExamVitals and nursing note reviewed. Constitutional:     General: He is not in acute distress.   Appearance: He is well-developed and normal weight. He is not ill-appearing, toxic-appearing or diaphoretic. HENT:    Head: Normocephalic and atraumatic.    Mouth/Throat:    Mouth: Mucous membranes are moist.    Pharynx: Oropharynx is clear. Eyes:    Extraocular Movements: Extraocular movements intact.    Pupils: Pupils are equal, round, and reactive to light. Cardiovascular:    Rate and Rhythm: Normal rate and regular rhythm. Pulmonary:    Effort: Pulmonary effort is normal. No respiratory distress.    Breath sounds: Normal breath sounds. No stridor. No wheezing. Abdominal:    Palpations: Abdomen is soft.    Tenderness: There is abdominal tenderness in the epigastric area. There is guarding. There is no rebound. Negative signs include Murphy's sign, Rovsing's sign and McBurney's sign. Skin:   General: Skin is warm and dry. Neurological:    General: No focal deficit present.    Mental Status: He is alert. Psychiatric:       Mood and Affect: Mood normal.              ProceduresAttestation/Critical CarePatient Reevaluation: Attending Supervised: PAI saw and examined the patient. I agree with the findings and plan of care as documented in the PA's note except as noted below. Additional acute and/or chronic problems addressed: Zafirah Vanzee GuyPatient presents with epigastric pain.  Has elevated lipase and signs of pancreatitis on imaging.  Admitted for further management.Clinical Impressions as of 06/01/23 0145 Pancreatitis, unspecified pancreatitis type [facility] CODE)  ED DispositionAdmit  Stockheimer, Britta Candy, PA05/28/25 0147 Noemi Batter, MD05/28/25 [phone]

## 2023-06-01 NOTE — ED Notes
 1:53 AM ED to Floor HandoffAdmission Dx:  pancreatitis unspecified             Telemetry: 	[]  Yes		[x]  NoOxygen Tank on Stretcher >1000 PSI:  []  YesCode Status:   [x]  Full		[]  Partial		[]  No CodeIsolation: 	[x]  None	[]  Contact	[]  Droplet	[]  AirborneSafety Precautions: [x]  None	[]  Sitter   []  Restraints	[]  Suicidal	[]  Fall Risk	Other (specify):           Mentation/Orientation:    A&O (person, place, time and situation) x   4       	 		Disoriented to:          Deficits: []  Hearing impaired	[]  Blind  	[]  Nonverbal		 []  ID/DD (intelectual disability/developmental disability)Ambulation: []  Ambulatory	[]  Non-Ambulatory  Diet: [x]  OK to eat	[]  NPO	Other (specify):           IV Access: [x]  Yes   []  No     Vital signs Charted in the last hour: [x] Yes    []  No         Other (specify)            Skin Alteration:[]  Pressure Injury	[]  Wound	[x]  None	[]  Skin Not AssessedDiarrhea/Loose stool : []  1x within 24h  []  2x within 24h  []  3x within 24h  []  None      C.Diff Order:  []  Ordered- needs to be collected    []  Collected-sent to lab    []  Resulted - Negative C.Diff  []  Resulted - Positive C.Diff    []  Not Ordered   	[x]  N/APatient Belongings:Does the patient have belongings going to the floor?   [x] Yes    []  NoIs someone taking belongings home?   [] Yes    [x]  No   Who (specify):           _________Maris Tobey Forte, RNPlease call the ED RN if you have any questions            You can always call the [facility] ED charge nurse at [phone] if you have any concerns [facility] ED [facility] Emergency Department

## 2023-06-01 NOTE — Utilization Review (ED)
 Utilization Review from XsolisPatient Data:  Patient Name: Clifford Valdez Age: 48 y.o. DOB: 13-Aug-1975	 MRN: [id number]	 Indicated Status: Inpatient Review Comments: Pancreatitis. Abdominal pain. Lipase 88. WBC 14.5. Tachycardic on arrival. IVF, pain control, antiemetics.

## 2023-06-01 NOTE — Other
 [facility]	 [facility]	Gastroenterology Consult Note Consult Information: Consultation requested by: Mantena, Surendranath, MDReason for consultation:  Abdominal painSource of Information: Patient and EMR/Previous RecordPresentation History: 48 year old male with past medical history of hypertension and distant history of pancreatitis admitted with acute onset epigastric pain that started on May 27th.  Pain radiating to his back.  Associated with nausea but no vomiting.  Normal bowel movements.  No melena or bright red blood per rectum.  Patient has been drinking, may have had 13-15 beers over the holiday weekend.  No fever chills or night sweatsHe is currently pain-free and hungryMedical History: PMH PSH Past Medical History: Diagnosis Date  Hypertension   No past surgical history on file. Social History Family History Social History Tobacco Use  Smoking status: Every Day   Current packs/day: 0.50   Types: Cigarettes  Smokeless tobacco: Never Substance Use Topics  Alcohol use: Yes   Comment: occassional  No family history on file. Prior to Admission Medications Medications Prior to Admission Medication Sig Dispense Refill Last Dose/Taking  losartan  (COZAAR ) 100 mg tablet Take 1 tablet (100 mg total) by mouth daily. 30 tablet 1 05/31/2023  metoprolol  tartrate (LOPRESSOR ) 25 mg Immediate Release tablet TOME UNA TABLETA DOS VECES AL DIA 180 tablet 1 05/31/2023  NIFEdipine XL (PROCARDIA-XL) 60 mg 24 hr tablet Take 1 tablet (60 mg total) by mouth daily.   05/31/2023  amLODIPine  (NORVASC ) 10 mg tablet Take 1 tablet (10 mg total) by mouth daily. (Patient not taking: Reported on 06/01/2023) 90 tablet 3 Not Taking  predniSONE  (DELTASONE ) 20 mg tablet Take 1 tablet (20 mg total) by mouth 2 (two) times daily. Take with food. (Patient not taking: Reported on 06/01/2023) 10 tablet 0 Not Taking  Allergies No Known Allergies Inpatient Medications:Current Facility-Administered Medications Medication  diazePAM (VALIUM) tablet 10 mg  diazePAM (VALIUM) tablet 10 mg  folic acid (FOLVITE) tablet 1 mg  losartan  (COZAAR ) tablet 100 mg  melatonin tablet 3 mg  metoprolol  tartrate (LOPRESSOR ) Immediate Release tablet 25 mg  morphine syringe 2 mg  nicotine (NICODERM CQ) transdermal patch 24 hr 7 mg  NIFEdipine XL (PROCARDIA-XL) 24 hr tablet 60 mg  sodium chloride 0.45 % infusion  sodium chloride 0.9 % flush 3 mL  sodium chloride 0.9 % flush 3 mL  thiamine (VITAMIN B1) tablet 100 mg Review of Systems: Otherwise, he constitutional, head, eye, ENT, musculoskeletal, skin, endocrine, hematologic, allergy, and immunologic ROS are unremarkable.Physical Exam: Vitals:I have reviewed the patient's current vital signs as documented in the patient's EMR.  Last 24 hours: Temp:  [97.6 ?F (36.4 ?C)-97.9 ?F (36.6 ?C)] 97.9 ?F (36.6 ?C)Pulse:  [71-110] 83Resp:  [16-18] 16BP: (132-173)/(87-128) 154/87SpO2:  [96 %-99 %] 96 %Intake/Output:I have reviewed the patient's current I&O's as documented in the EMR. and No intake/output data recorded.Physical Exam:Vitals:  06/01/23 0822 BP: (!) 154/87 Pulse: 83 Resp: 16 Temp: 97.9 ?F (36.6 ?C)  PHYSICAL EXAMINATION Appearance:   Well-developed well-nourished in no acute distress Skin  No rashes, anicteric.  HEENT  Normocephalic, mucus membranes moist, anicteric conjunctiva. Neck  No nodes, thyromegaly, Lungs  Clear to auscultation, breath sounds equal bilaterally. Heart  Rhythm regular. Normal S1 and S2. No murmurs,  Abdomen Normoactive bowel sounds, nondistended, nontender, and no hepatosplenomegaly, no shifting dullness. Extretmities  No cyanosis or edema.   Neuro   Alert and cooperative. Review of Labs/Diagnostics: Lab Review:I have reviewed the patient's labs within the last 24 hrs.Recent Labs Lab 05/27/252041 05/28/250542 NA 138 139 K 3.9 4.0 CL 101  104 CO2 22 24 BUN 17 14 CREATININE 1.19 1.01 CALCIUM 9.5 9.3 PROT 7.9  --  BILITOT 0.4  --  ALKPHOS 106  --  ALT 56  --  AST 32  --  GLU 106* 108* WBC 14.5* 11.7* HGB 15.2 15.1 HCT 44.20 43.80 PLT 258 250 MCV 89.3 89.6 Diagnostic Review:I have reviewed the patient's Radiology report(s) within the last 48 hrs. All results are within normal limits.  US  RIGHT UPPER QUADRANT OF ABDOMEN. HISTORY: ruq pain.  COMPARISON: NONE. TECHNIQUE: Grayscale and color Doppler imaging were performed.  FINDINGS:  LIVER: Echotexture: Diffusely echogenic. Focal lesions: None. Intrahepatic biliary ductal dilatation: None.BILIARY TREE: Common duct measures 0.3 cm. GALLBLADDER: Unremarkable. Sonographic Abigail Abler sign is negative.  PANCREAS: Visualized portions are unremarkable.  RIGHT KIDNEY: Size: 12.2 cm in maximal sagittal dimension.  Echogenicity: Normal. Hydronephrosis: Not present. Calculi: None visualized. Focal lesions: None.  UPPER ABDOMINAL AORTA: Measures 2.2 cm. UPPER ABDOMINAL IVC: Patent.ADDITIONAL FINDINGS:  None IMPRESSION:  No evidence of cholelithiasis or acute cholecystitis. Echogenic liver, which may be seen with hepatic steatosis or other hepatic parenchymal diseases. Impression: 48 year old male history of hypertension distant history of pancreatitis presents with the acute onset abdominal pain.  He is currently almost pain-free.  Has been drinking an excessive amount of beer the day prior to admission.  Abdominal examination at this time is benign.  Right upper quadrant ultrasound shows normal gallbladder, hepatic steatosis and visualized pancreas unremarkable.  Lipase was minimally elevated at 88I have reviewed the patient's problem list and updated it as needed.Recommendations: Can start low-fat dietWe discussed alcohol in his risk of pancreatitis.  He understands If tolerates diet can be discharged and follow up as an outpatientWould discharge on pantoprazole or equivalent once a daySigned: Margreta Sheriff, MD For questions please Office: [phone]/28/20259:26 AM

## 2023-06-01 NOTE — H&P
 [facility]	 Medicine History & PhysicalHistory provided by: the patient and EMR reviewHistory limited by: no limitationsPatient presents from: home Subjective: Chief Complaint: abdominal pain HPI 48 yo male wit pmh as below, here for abdominal pain. Pt states that pain started yesterday, is in epigastric region and radiates to back. Pain is sharp, constant, is not associated with food. No history of nausea, vomiting, shortness of breath, chest pain, or palpitation. In er pt given zofran, Maalox, fluids. Pepcid, and Toradol . Pain is similar to his pancreatitis in the past . Had about 13 beers a day over the weekend, last drink 2 days ago. Medical History: PMH PSH Past Medical History: Diagnosis Date  Hypertension   No past surgical history on file.  Family History family history is not on file.Social History  reports that he has been smoking cigarettes. He has never used smokeless tobacco. He reports current alcohol use. He reports that he does not use drugs.Prior to Admission Medications (Not in a hospital admission) Allergies No Known Allergies Review of Systems: Review of Systems Constitutional:  Positive for activity change. HENT: Negative.   Eyes: Negative.  Respiratory: Negative.   Gastrointestinal:  Positive for abdominal pain. Endocrine: Negative.  Genitourinary: Negative.  Musculoskeletal: Negative.  Skin: Negative.  Allergic/Immunologic: Negative.  Neurological: Negative.  Hematological: Negative.  Psychiatric/Behavioral: Negative.    Objective: Vitals:Last 24 hours: Temp:  [97.6 ?F (36.4 ?C)-97.9 ?F (36.6 ?C)] 97.6 ?F (36.4 ?C)Pulse:  [71-110] 86Resp:  [16-18] 18BP: (132-173)/(87-128) 148/89SpO2:  [99 %] 99 %Physical Exam: Physical ExamHENT: Head: Normocephalic and atraumatic.    Right Ear: Tympanic membrane, ear canal and external ear normal.    Left Ear: Tympanic membrane, ear canal and external ear normal.    Nose: Nose normal.    Mouth/Throat:    Pharynx: Oropharynx is clear. Eyes:    Pupils: Pupils are equal, round, and reactive to light. Cardiovascular:    Rate and Rhythm: Normal rate.    Pulses: Normal pulses. Pulmonary:    Effort: Pulmonary effort is normal. Abdominal:    General: Bowel sounds are normal.    Tenderness: There is abdominal tenderness.    Comments: Epigastric tenderness  Musculoskeletal:       General: Normal range of motion.    Cervical back: Normal range of motion. Skin:   General: Skin is warm. Neurological:    Mental Status: He is alert and oriented to person, place, and time. Psychiatric:       Mood and Affect: Mood normal.       Behavior: Behavior normal.  Labs: Last 24 hours: Recent Results (from the past 24 hours) Lipase  Collection Time: 05/31/23  8:41 PM Result Value Ref Range  Lipase 88 (H) 11 - 55 U/L Hepatic function panel  Collection Time: 05/31/23  8:41 PM Result Value Ref Range  Total Bilirubin 0.4 <=1.2 mg/dL  Bilirubin, Direct 0.1 <=0.2 mg/dL  Alkaline Phosphatase 628 9 - 122 U/L  Alanine Aminotransferase (ALT) 56 9 - 59 U/L  Aspartate Aminotransferase (AST) 32 10 - 35 U/L  AST/ALT Ratio 0.6 Reference Range Not Established  Total Protein 7.9 5.9 - 8.3 g/dL  Albumin 4.4 3.6 - 5.1 g/dL  Globulin 3.5 2.0 - 3.9 g/dL  A/G Ratio 1.3 1.0 - 2.2 CBC auto differential  Collection Time: 05/31/23  8:41 PM Result Value Ref Range  WBC 14.5 (H) 4.0 - 11.0 x1000/?L  RBC 4.95 4.00 - 6.00 M/?L  Hemoglobin 15.2 13.2 - 17.1 g/dL  Hematocrit 31.51 76.16 - 50.00 %  MCV 89.3 80.0 - 100.0 fL  MCH 30.7 27.0 - 33.0 pg  MCHC 34.4 31.0 - 36.0 g/dL  RDW-CV 16.1 09.6 - 04.5 %  Platelets 258 150 - 420 x1000/?L  MPV 12.7 (H) 8.0 - 12.0 fL  Neutrophils 70.5 39.0 - 72.0 %  Lymphocytes 17.3 17.0 - 50.0 %  Monocytes 9.2 4.0 - 12.0 %  Eosinophils 2.1 0.0 - 5.0 %  Basophil 0.6 0.0 - 1.4 %  Immature Granulocytes 0.3 0.0 - 1.0 %  nRBC 0.0 0.0 - 1.0 %  Absolute Lymphocyte Count 2.50 0.60 - 3.70 x 1000/?L  Monocyte Absolute Count 1.33 (H) 0.00 - 1.00 x 1000/?L  Eosinophil Absolute Count 0.31 0.00 - 1.00 x 1000/?L  Basophil Absolute Count 0.09 0.00 - 1.00 x 1000/?L  Absolute Immature Granulocyte Count 0.05 0.00 - 0.30 x 1000/?L  Absolute nRBC 0.00 0.00 - 1.00 x 1000/?L  ANC (Abs Neutrophil Count) 10.19 (H) 2.00 - 7.60 x 1000/?L Basic metabolic panel  Collection Time: 05/31/23  8:41 PM Result Value Ref Range  Sodium 138 136 - 144 mmol/L  Potassium 3.9 3.3 - 5.3 mmol/L  Chloride 101 98 - 107 mmol/L  CO2 22 20 - 30 mmol/L  Anion Gap 15 7 - 17  Glucose 106 (H) 70 - 100 mg/dL  BUN 17 6 - 20 mg/dL  Creatinine 4.09 8.11 - 1.30 mg/dL  Calcium 9.5 8.8 - 91.4 mg/dL  BUN/Creatinine Ratio 78.2 8.0 - 23.0  eGFR (Creatinine) >60 >=60 mL/min/1.7m2  Creatinine Delta   Reviewed by me. Diagnostics:[place] Renal Stone wo IV Contrast No Oral [[id number]] Collected: 06/01/23 0027 Order Status: Completed Updated: 06/01/23 0055 Narrative:   [place] RENAL STONE WO IV CONTRAST NO ORAL(BH YH YHC GH LM WH)INDICATION: right flank painAdditional clinical information:48 year old male with a past medical history of hypertension is presenting today with epigastric abdominal pain since 10:30 a.m.Aaron Aas  Patient states it is epigastric abdominal pain radiates to the midthoracic region of his back.  Patient denies any nausea, vomiting, diarrhea, chest pain, shortness of breath, or urinary symptoms.  COMPARISON: NONETechnique: [place] images were obtained from the superior aspect of the kidneys to the pubic symphysis without administration of intravenous contrast utilizing a flank pain protocol. Coronal and sagittal multiplanar reformatted images were provided.FINDINGS:KIDNEYS & URETERS: The kidneys are normal, without hydronephrosis or calculi. No ureteral calcifications.URINARY BLADDER: Unremarkable.LOWER CHEST: Unremarkable.LIVER: Hepatic steatosis.GALLBLADDER: Unremarkable.  SPLEEN: Unremarkable.PANCREAS: Pancreas is normal in size. No ductal dilatation. No discrete mass. Mild peripancreatic fat stranding surrounding the uncinate process and the third part of duodenum, unclear if this is from duodenitis versus pancreatitis.ADRENALS: A tiny 6 mm left adrenal adenoma. Mild thickening of the adrenal glands.BOWEL: No bowel obstruction. Mild wall thickening of the third portion of the duodenum with surrounding fat stranding, extending around the uncinate process of the pancreas.APPENDIX: Unremarkable.  PERITONEUM: No ascites or intraperitoneal free air.LYMPH NODES: No abnormal or enlarged lymph nodes are seen.VESSELS: Unremarkable.PELVIS: Unremarkable.BONES & SOFT TISSUE: No aggressive osseous lesion. No acute osseous abnormality. Transitional lumbosacral junction anatomy. There are degenerative changes in the lumbar spine, most prominently at L4-5 with mild disc space narrowing, Schmorl's nodes, endplate sclerosis and posterior disc osteophyte complex. Small fat-containing umbilical hernia. Impression:   No hydronephrosis or calculiMild fat stranding surrounding the uncinate process of pancreas and the third part of duodenum, unclear if this is from duodenitis versus pancreatitis. Correlate clinically.[facility] Notify System Classification: Routine.Reported and signed by: Licia Reek, MD US  Right Upper Quadrant [[id number]] Collected: 05/31/23 2024 Order Status: Completed  Updated: 05/31/23 2027 Narrative:   US  RIGHT UPPER QUADRANT OF ABDOMEN.HISTORY: ruq pain.COMPARISON: NONE.TECHNIQUE: Grayscale and color Doppler imaging were performed.FINDINGS:LIVER:Echotexture: Diffusely echogenic.Focal lesions: None.Intrahepatic biliary ductal dilatation: None.BILIARY TREE: Common duct measures 0.3 cm.GALLBLADDER: Unremarkable. Sonographic Abigail Abler sign is negative.PANCREAS: Visualized portions are unremarkable.RIGHT KIDNEY:Size: 12.2 cm in maximal sagittal dimension.  Echogenicity: Normal.Hydronephrosis: Not present.Calculi: None visualized.Focal lesions: None.UPPER ABDOMINAL AORTA: Measures 2.2 cm.UPPER ABDOMINAL IVC: Patent.ADDITIONAL FINDINGS:  None Impression:    No evidence of cholelithiasis or acute cholecystitis. Echogenic liver, which may be seen with hepatic steatosis or other hepatic parenchymal diseases.[facility] Notify System Classification: Routine.Reported and signed by: Belvie Boyers, MD Reviewed by me. ECG/Tele Events: No ECG ordered todayAssessment: Principal Problem:  Pancreatitis, unspecified pancreatitis type (HC CODE)  SNOMED [place](R): PANCREATITIS  Plan: Will admit to general medical floor. Pancreatitis: clear liquid diet, iv fluids, pain medication, monitor intake and output, monitor renal functions. Gi eval. Nicotine patch for smoking. Etoh: monitor ciwa, valium prn, folic acid and thiamine. Htn: resume home bp meds. Veno dyne boots for dvt prophylaxis. Fall and aspiration precaution. I have discussed the patients code status:No: with:  Comorbidities Comorbidities present on admission:Immunosuppressed due to prescription of immunosuppressive medication prior to admission.  Secondary diagnoses occurring during hospitalization: Notifications: PCP: Finley Hugh [phone]    I have personally notified the patient's primary care provider of this admission. Yes  Family was notified of this admission. NoI have personally discussed the plan with the patient and/or family. YesI have reviewed the plan of care with the bedside nurse, including an emphasis on the most important aspects of care for the next 12 hours: noAdvance Care Planning Advance Care Planning Patient has a living will: noPatient has healthcare representative/proxy: [phone] enter code [id number] if patient answers Yes or code [id number] if patient answers No to any of the above questions and you are not completing a 30 minute or more Advanced Care Planning encounter. Electronically Signed:Darol Cush Farhana Ryder [place], DO 06/01/2023, 2:41 AMAddendum: None

## 2023-06-01 NOTE — Plan of Care
 Plan of Care Overview/ Patient StatusA&O x 4, VSS, no tele, no complaints of pain. Lungs diminished. Skin intact. Pt independent. Continent x 2. No BM on shift. Voids in urinal. Meds given per MAR, pills whole. Fall & safety maintained. See flowsheet/

## 2023-06-01 NOTE — Progress Notes
 [facility]Daily Progress NoteHospital Medicine ServiceAttending Provider: Elias Gros, MD  Location:  9550/9550-BHospital Day #0CC: Pancreatitis, unspecified pancreatitis type [facility] CODE)Subjective   Patient seen and examined at bedside on 06/01/2023.Interim History: 48 year old male with past medical history of hypertension and a history of pancreatitis 5 years ago admitted for acute epigastric pain which started yesterday.  Pain radiating to back, not related to eating or bowel movements.  Associated with nausea but no vomiting.  Has not had a bowel movement since yesterday. No history of melena or hematochezia.He drinks about 13-14 beers on weekends and has been drinking over the holiday weekend.No fever, chills or night sweatsContinues to endorse some epigastric pain.  PMH PSH Past Medical History: Diagnosis Date  Hypertension   No past surgical history on file. Allergies No Known Allergies Allergies have been reviewed. PMH and social history reviewed.Current Medications:folic acid, 1 mg, Dailylosartan, 100 mg, Dailymetoprolol tartrate, 25 mg, BIDnicotine, 7 mg, DailyNIFEdipine XL, 60 mg, Dailysodium chloride, 3 mL, Q8Hthiamine, 100 mg, DailyCurrent Infusions: sodium chloride 100 mL/hr (06/01/23 0619) PRN Medications:diazePAM, diazePAM, melatonin, morphine (ADULT), sodium chlorideObjective  Vitals:Vitals:  05/31/23 2230 06/01/23 0157 06/01/23 0329 06/01/23 0822 BP: 132/87 (!) 143/87 (!) 148/89 (!) 154/87 Pulse: 71 73 86 83 Resp: 16 18 18 16  Temp: 97.9 ?F (36.6 ?C) 97.7 ?F (36.5 ?C) 97.6 ?F (36.4 ?C) 97.9 ?F (36.6 ?C) TempSrc: Oral  Oral Oral SpO2: 99% 99% 99% 96% Weight:   91.2 kg  Height:   5' 6 (1.676 m)  Gross Totals (Last 24 hours) at 06/01/2023 0842Last data filed at 06/01/2023 0242Intake 1000 ml Output -- Net 1000 ml Physical Exam General: Alert, NAD, Oriented X 3, Fluent speechNeck: Supple, No JVD, No bruitCVS: RRR, S1/S2Chest: CTA Bilaterally, No rhonchiAbdomen: Soft, obese, some tenderness without guarding in the epigastric area, BS normoactiveSkin/Extremities: No rash, No LE edemaNeuro: No focal deficits   Labs: CBCRecent Labs   05/27/252041 05/28/250542 WBC 14.5* 11.7* HGB 15.2 15.1 PLT 258 250 MCV 89.3 89.6  CoagNo results for input(s): LABPROT, INR, PTT, DDIMER in the last 72 hours. BMPRecent Labs   05/27/252041 05/28/250542 NA 138 139 K 3.9 4.0 CL 101 104 CO2 22 24 ANIONGAP 15 11 CALCIUM 9.5 9.3 BUN 17 14 CREATININE 1.19 1.01 Estimated Creatinine Clearance: 82 mL/min (by C-G formula based on SCr of 1.01 mg/dL). CardiacNo results for input(s): TROPONINI, CKTOTAL, CKMBINDEX in the last 72 hours.Invalid input(s): CKMBMASS, PROBNP LFTRecent Labs   05/27/252041 AST 32 ALT 56 ALKPHOS 106 ALBUMIN 4.4 PROT 7.9 BILITOT 0.4 BILIDIR 0.1 LIPASE 88*  MetabolicNo results for input(s): CORTISOL, HGBA1C, TSH, FREET4, T3TOTAL, LDL, HDL, CHOL, TRIG in the last 72 hours. GlucoseRecent Labs   05/27/252041 05/28/250542 GLU 106* 108* UANo results for input(s): SPECGRAV, PHUR, RBCUA, WBCUA, LEUKOCYTESUR, NITRITE, PROTEINUA, GLUCOSEU, KETONESU, BILIRUBINUR, UROBILINOGEN in the last 168 hours.Creatinine ClearanceEstimated Creatinine Clearance: 82 mL/min (by C-G formula based on SCr of 1.01 mg/dL).Microbiology: Blood CulturesNo results for input(s): LABBLOO in the last 168 hours. Urine CulturesNo results for input(s): LABURIN in the last 168 hours.Respiratory Cultures No results for input(s): LOWERRESPIRA in the last 168 hours. Imaging: [place] Renal Stone wo IV Contrast No OralResult Date: 5/28/2025CT RENAL STONE WO IV CONTRAST NO ORAL(BH YH YHC GH LM WH) INDICATION: right flank pain Additional clinical information:48 year old male with a past medical history of hypertension is presenting today with epigastric abdominal pain since 10:30 a.m.Aaron Aas  Patient states it is epigastric abdominal pain radiates to the midthoracic region of his back.  Patient denies any nausea, vomiting, diarrhea, chest  pain, shortness of breath, or urinary symptoms.  COMPARISON: NONE Technique: [place] images were obtained from the superior aspect of the kidneys to the pubic symphysis without administration of intravenous contrast utilizing a flank pain protocol. Coronal and sagittal multiplanar reformatted images were provided. FINDINGS: KIDNEYS & URETERS: The kidneys are normal, without hydronephrosis or calculi. No ureteral calcifications. URINARY BLADDER: Unremarkable. LOWER CHEST: Unremarkable. LIVER: Hepatic steatosis. GALLBLADDER: Unremarkable.  SPLEEN: Unremarkable. PANCREAS: Pancreas is normal in size. No ductal dilatation. No discrete mass. Mild peripancreatic fat stranding surrounding the uncinate process and the third part of duodenum, unclear if this is from duodenitis versus pancreatitis. ADRENALS: A tiny 6 mm left adrenal adenoma. Mild thickening of the adrenal glands. BOWEL: No bowel obstruction. Mild wall thickening of the third portion of the duodenum with surrounding fat stranding, extending around the uncinate process of the pancreas. APPENDIX: Unremarkable.  PERITONEUM: No ascites or intraperitoneal free air. LYMPH NODES: No abnormal or enlarged lymph nodes are seen. VESSELS: Unremarkable. PELVIS: Unremarkable. BONES & SOFT TISSUE: No aggressive osseous lesion. No acute osseous abnormality. Transitional lumbosacral junction anatomy. There are degenerative changes in the lumbar spine, most prominently at L4-5 with mild disc space narrowing, Schmorl's nodes, endplate sclerosis and posterior disc osteophyte complex. Small fat-containing umbilical hernia. No hydronephrosis or calculi Mild fat stranding surrounding the uncinate process of pancreas and the third part of duodenum, unclear if this is from duodenitis versus pancreatitis. Correlate clinically. [place] [facility] Notify System Classification: Routine. Reported and signed by: Licia Reek, MD US  Right Upper QuadrantResult Date: 5/27/2025US RIGHT UPPER QUADRANT OF ABDOMEN. HISTORY: ruq pain. COMPARISON: NONE. TECHNIQUE: Grayscale and color Doppler imaging were performed. FINDINGS: LIVER: Echotexture: Diffusely echogenic. Focal lesions: None. Intrahepatic biliary ductal dilatation: None. BILIARY TREE: Common duct measures 0.3 cm. GALLBLADDER: Unremarkable. Sonographic Abigail Abler sign is negative. PANCREAS: Visualized portions are unremarkable. RIGHT KIDNEY: Size: 12.2 cm in maximal sagittal dimension.  Echogenicity: Normal. Hydronephrosis: Not present. Calculi: None visualized. Focal lesions: None. UPPER ABDOMINAL AORTA: Measures 2.2 cm. UPPER ABDOMINAL IVC: Patent. ADDITIONAL FINDINGS:  None  No evidence of cholelithiasis or acute cholecystitis. Echogenic liver, which may be seen with hepatic steatosis or other hepatic parenchymal diseases. [facility] Notify [facility] Classification: Routine. Reported and signed by: Belvie Boyers, MD ECG/Tele Events: Results for orders placed or performed during the hospital encounter of 08/03/19 EKG Result Value Ref Range  Heart Rate 76 bpm  QRS Duration 88 ms  Q-T Interval 374 ms  QTC Calculation(Bezet) 420 ms  P Axis 47 deg  R Axis -3 deg  T Axis -5 deg  P-R Interval 170 msec  SEVERITY Abnormal ECG severity Assessment & Plan: Taevyn Hausen is a 48 y.o. male with a pmh of hypertension and pancreatitis who presents with acute onset epigastric pain.  Right upper quadrant ultrasound shows normal gallbladder, hepatic steatosis and visualized pancreas is unremarkable.  Lipase 88# epigastric pain# Mild pancreatitis vs gastritisMild lipase elevationUltrasound unremarkableWe will advance dietPatient continues to endorse some pain.If he is able to tolerate a diet and remains pain free overnight, will discharge home in the a.m.GI consulted, appreciate recommendations# alcohol abuse# smokerDenies any other substance useCounseled about alcohol useContinue to monitor CIWA, Valium p.r.n.SCDs for DVT prophylaxisContinue thiamine# HTNContinue nifedipine losartan  and metoprololLow-fat dietDiet: Diet Clear LiquidPatient's last documented bowel movement:Last Bowel Movement: 05/27/25Consults:IP CONSULT TO GASTROENTEROLOGYIP CONSULT TO SOCIAL WORKIP CONSULT TO RECOVERY VOLUNTEER SERVICESAMPAC Mobility Score: 24TARGET Highest Level of Mobility: Mobility Level 8 Walk 250+ feetPT/OT assessment: Discharge Planning:   Expected discharge location: Home Primary Emergency  Contact: Mukherjee,Iroina, Home Phone: [phone]'s, wife at bedside, updated about clinical status and plan of care. Questions answered.  Full CodeLabwork and imaging reviewed.Consultant notes reviewed, discussed with consultants and recommendations implemented.Parts of this note were dictated using M*Modal transcription software with attempt to correct any dictation errors.  Please feel free to contact for any clarifications.Signed:Lyfe Reihl, MDPlease contact using M[facility] (Hours 8 AM to 4 PM)5/28/20258:42 AM

## 2023-06-01 NOTE — ED Notes
 1:39 AM Assumed care of pt, pt ambulatory to bathroom, endorsing no complaints at this time, and pending admission.2:02 AM NE9 contacted multiple times without answer, transport booked at this time.2:43 AM Floor contacted this RN, pending pt transport to floor at this time. Pt resting in recliner at this time in no acute distress.

## 2023-06-01 NOTE — Consults
 Pt., received S.O.S from the [facility]

## 2023-06-01 NOTE — ED Notes
 12:43 AM Pt received in sf hallway 1.Pt with history of 1 day of epigastric abd pain since 1020 this am. Pain radiates to midthoracic region of his back. Denies any n/v/d/cp/soob. Pain is 5/10.  Denies any traumas.  Pt had US  done and [place] done. Pt s/b PA. Past Medical History: Diagnosis Date  Hypertension  No past surgical history on file.12:56 AM IV placed into right ac and IVF bolus started.1:02 AM Pt moved over to infusion 1 with report to [facility].

## 2023-06-01 NOTE — ED Notes
 3:13 AM Assumed care of patient, received report from [place], [place]. Patient is siting in recliner chair. GCS 15, airway patent, breathing unlabored. Patient denies any complaints. Pending transported to [facility].

## 2023-06-01 NOTE — Plan of Care
 Plan of Care Overview/ Patient StatusAdmission Note Nursing Clifford Valdez is a 48 y.o. male admitted with a chief complaint of pancreatitis. Patient arrived from  EDPatient is  Orientation: oriented x 4 Vitals:  05/31/23 1803 05/31/23 2230 06/01/23 0157 06/01/23 0329 BP:  132/87 (!) 143/87 (!) 148/89 Pulse:  71 73 86 Resp:  16 18 18  Temp:  97.9 ?F (36.6 ?C) 97.7 ?F (36.5 ?C) 97.6 ?F (36.4 ?C) TempSrc:  Oral  Oral SpO2:  99% 99% 99% Weight: 91.6 kg   91.2 kg Height:    5' 6 (1.676 m) Oxygen therapy Oxygen TherapySpO2: 99 %Device (Oxygen Therapy): room airI have reviewed the patient's current medication orders.     Current Facility-Administered Medications Medication Dose Route Frequency Provider Last Rate Last Admin  sodium chloride 0.9 % flush 3 mL  3 mL IV Push Q8H Afrin, Syeda Farhana, DO      melatonin, sodium chlorideCurrent Facility-Administered Medications Medication Dose Route Frequency Last Rate  melatonin  3 mg Oral Nightly PRN    sodium chloride  3 mL IV Push Q8H    sodium chloride  3 mL IV Push PRN for Line Care   .I have reviewed patient valuables Belongings charted in last 7 days: Patient Valuables   Patient Valuables Flowsheet                    PATIENT VALUABLE(S)       Cell phone disposition At bedside/locker/closet 06/01/23 0340  Clothing Disposition At bedside/locker/closet 06/01/23 0340   Wallet Disposition At bedside/locker/closet 06/01/23 0340     Comments:Assumed care @ 0300-0700. Alert & Oriented x 4. On room air, sats >95%. Denies shortness of breath/chest pain/dizziness, no respiratory distress noted. Moves all extremities, +pulses, +sensation. PERRLA. Diet: NPO. Abdomen soft, non-tender. +Bowel Sounds. Denies nausea/vomiting. Continent of bowel/bladder. Independent in the room. Skin: Intact. Right peripheral IV: capped. Safety maintained. Patient expresses no other needs at this time. Call light within reach. See flowsheets for full assessment.Vanderbilt Gene BSN, RN

## 2023-06-01 NOTE — Plan of Care
 Plan of Care Overview/ Patient StatusProblem: Adult Inpatient Plan of CareGoal: Readiness for Transition of CareOutcome: Interventions implemented as appropriate 48 year old male with past medical history of hypertension and distant history of pancreatitis admitted with acute onset epigastric pain that started on May 27th. Pain radiating to his back. Chart reviewed. I introduced myself and described my role. Insurance and demographics verified. [place] reported that he lives with his significant other. [place] reported that he is independent with ADLs. Has BP machine. No prior services. Significant other can transport home.Social Work will take lead to arrange post- acute care services and continue to work with the care team as the patient progresses towards discharge. CM/SW Screening Note  Flowsheet Row Most Recent Value Case Management/Social Work Screening: Chart review completed. If YES to any question below then proceed to Eval/Plan  Is there a change in their cognitive function No Do you anticipate that the patient will have any discharge needs requiring CM/SW intervention? No Has there been an unscheduled readmission within the last 30 days and/or four (4) encounters (encounters include: ED, OBS, Inpatient) within the last six (6) months? No Were there services prior to admission ( Examples: Acute [facility],  Assisted Living, HD, Homecare, Extended Care Facility, Methadone, SNF, Outpatient Infusion Center) No Concern for current abuse/neglect/interpersonal violence/sexual assault  No Connected to state agency? No Guardianship or conservatorship No Negative/Positive Screen Negative Screening: Case Management department will follow patient's progress and discuss plan of care with treatment team. Case Manager/Social Work Attestation  I have reviewed the medical record and completed the above screen. CM/SW staff will follow patient's progress and discuss the plan of care with the Treatment Team. Yes  Nichole Barker, LMSWSocial work case Production designer, theatre/television/film [phone]

## 2023-06-02 DIAGNOSIS — E669 Obesity, unspecified: Secondary | ICD-10-CM

## 2023-06-02 DIAGNOSIS — Z6832 Body mass index (BMI) 32.0-32.9, adult: Secondary | ICD-10-CM

## 2023-06-02 DIAGNOSIS — Z79899 Other long term (current) drug therapy: Secondary | ICD-10-CM

## 2023-06-02 DIAGNOSIS — F1010 Alcohol abuse, uncomplicated: Secondary | ICD-10-CM

## 2023-06-02 DIAGNOSIS — K859 Acute pancreatitis without necrosis or infection, unspecified: Secondary | ICD-10-CM

## 2023-06-02 DIAGNOSIS — K760 Fatty (change of) liver, not elsewhere classified: Secondary | ICD-10-CM

## 2023-06-02 DIAGNOSIS — I10 Essential (primary) hypertension: Secondary | ICD-10-CM

## 2023-06-02 DIAGNOSIS — D84821 Immunodeficiency due to drugs: Secondary | ICD-10-CM

## 2023-06-02 DIAGNOSIS — Z28311 Partially vaccinated for covid-19: Secondary | ICD-10-CM

## 2023-06-02 DIAGNOSIS — F17210 Nicotine dependence, cigarettes, uncomplicated: Secondary | ICD-10-CM

## 2023-06-02 LAB — CBC WITH AUTO DIFFERENTIAL
BKR WAM ABSOLUTE IMMATURE GRANULOCYTES.: 0.03 x 1000/ÂµL (ref 0.00–0.30)
BKR WAM ABSOLUTE LYMPHOCYTE COUNT.: 1.97 x 1000/ÂµL (ref 0.60–3.70)
BKR WAM ABSOLUTE NRBC: 0 x 1000/ÂµL (ref 0.00–1.00)
BKR WAM ANC (ABSOLUTE NEUTROPHIL COUNT): 7.99 x 1000/ÂµL — ABNORMAL HIGH (ref 2.00–7.60)
BKR WAM BASOPHIL ABSOLUTE COUNT.: 0.07 x 1000/ÂµL (ref 0.00–1.00)
BKR WAM BASOPHILS: 0.6 % (ref 0.0–1.4)
BKR WAM EOSINOPHIL ABSOLUTE COUNT.: 0.31 x 1000/ÂµL (ref 0.00–1.00)
BKR WAM EOSINOPHILS: 2.8 % (ref 0.0–5.0)
BKR WAM HEMATOCRIT: 41.9 % (ref 38.50–50.00)
BKR WAM HEMOGLOBIN: 14.3 g/dL (ref 13.2–17.1)
BKR WAM IMMATURE GRANULOCYTES: 0.3 % (ref 0.0–1.0)
BKR WAM LYMPHOCYTES: 17.8 % (ref 17.0–50.0)
BKR WAM MCH: 31.2 pg (ref 27.0–33.0)
BKR WAM MCHC: 34.1 g/dL (ref 31.0–36.0)
BKR WAM MCV: 91.5 fL (ref 80.0–100.0)
BKR WAM MONOCYTE ABSOLUTE COUNT.: 0.7 x 1000/ÂµL (ref 0.00–1.00)
BKR WAM MONOCYTES: 6.3 % (ref 4.0–12.0)
BKR WAM MPV: 12.9 fL — ABNORMAL HIGH (ref 8.0–12.0)
BKR WAM NEUTROPHILS: 72.2 % — ABNORMAL HIGH (ref 39.0–72.0)
BKR WAM NUCLEATED RED BLOOD CELLS: 0 % (ref 0.0–1.0)
BKR WAM PLATELETS: 245 x1000/ÂµL (ref 150–420)
BKR WAM RDW-CV: 12.9 % (ref 11.0–15.0)
BKR WAM RED BLOOD CELL COUNT.: 4.58 M/ÂµL (ref 4.00–6.00)
BKR WAM WHITE BLOOD CELL COUNT: 11.1 x1000/ÂµL — ABNORMAL HIGH (ref 4.0–11.0)

## 2023-06-02 LAB — BASIC METABOLIC PANEL
BKR ANION GAP: 12 (ref 7–17)
BKR BLOOD UREA NITROGEN: 14 mg/dL (ref 6–20)
BKR BUN / CREAT RATIO: 15.2 (ref 8.0–23.0)
BKR CALCIUM: 9.2 mg/dL (ref 8.8–10.2)
BKR CHLORIDE: 103 mmol/L (ref 98–107)
BKR CO2: 23 mmol/L (ref 20–30)
BKR CREATININE DELTA: -0.09
BKR CREATININE: 0.92 mg/dL (ref 0.40–1.30)
BKR EGFR, CREATININE (CKD-EPI 2021): >60 mL/min/{1.73_m2} (ref >=60)
BKR GLUCOSE: 113 mg/dL — ABNORMAL HIGH (ref 70–100)
BKR POTASSIUM: 4.5 mmol/L (ref 3.3–5.3)
BKR SODIUM: 138 mmol/L (ref 136–144)

## 2023-06-02 MED ORDER — THIAMINE HCL (VITAMIN B1) 100 MG TABLET
100 | ORAL_TABLET | Freq: Every day | ORAL | 1 refills | 30.00000 days | Status: AC
Start: 2023-06-02 — End: ?

## 2023-06-02 NOTE — Plan of Care
 Plan of Care Overview/ Patient Status:Admitting Diagnosis: pancreatitisIsolation: noneNeuro: alert and oriented x 4Cardiac: no teleRespiratory: pulse ox 99% on room airGI(last BM) /GU: last bm 5/27- continent of bowel and bladderDiet: fat-restrictedSkin: intactMobility: independent IV Access/Drainage: R arm IV intact and patentSafety: bed in lowest position, call bell within reach, safety rounds contBelongings: at bedsidePatient Home Medications: noneAmount of Meal taken (none/ partial, all): Breakfast:   Lunch:    Dinner:Note: Assumed care of pt at 7 pm- no c/o pain or sob at this time- meds given per [facility]- resting comfortably-cont to monitorAMPAC Mobility Score: 24TARGET Highest Level of Mobility: Mobility Level 8 Walk 250+ feet Problem: Adult Inpatient Plan of CareGoal: Plan of Care ReviewOutcome: Interventions implemented as appropriateGoal: Patient-Specific Goal (Individualized)Outcome: Interventions implemented as appropriateGoal: Absence of Hospital-Acquired Illness or InjuryOutcome: Interventions implemented as appropriateGoal: Optimal Comfort and WellbeingOutcome: Interventions implemented as appropriateGoal: Readiness for Transition of CareOutcome: Interventions implemented as appropriate Problem: Alcohol WithdrawalGoal: Alcohol Withdrawal Symptom ControlOutcome: Interventions implemented as appropriateGoal: Optimal Neurologic FunctionOutcome: Interventions implemented as appropriateGoal: Readiness for Change IdentifiedOutcome: Interventions implemented as appropriate Problem: Fall Injury RiskGoal: Absence of Fall and Fall-Related InjuryOutcome: Interventions implemented as appropriate

## 2023-06-02 NOTE — Progress Notes
 [facility]	Gastroenterology Progress NoteAttending Provider: Elias Gros, MD Subjective: Interim History:  No abdominal pain, eating.  No nausea vomitingObjective: Vitals:Temp:  [97 ?F (36.1 ?C)-98.1 ?F (36.7 ?C)] 97.9 ?F (36.6 ?C)Pulse:  [70-83] 70Resp:  [16-20] 20BP: (148-161)/(79-98) 161/98SpO2:  [96 %-99 %] 99 %Device (Oxygen Therapy): room airI/O's:No intake or output data in the 24 hours ending 06/02/23 0759Prior to Admission Medications Medications Prior to Admission Medication Sig Dispense Refill Last Dose/Taking  losartan  (COZAAR ) 100 mg tablet Take 1 tablet (100 mg total) by mouth daily. 30 tablet 1 05/31/2023  NIFEdipine  XL (PROCARDIA -XL) 60 mg 24 hr tablet Take 1 tablet (60 mg total) by mouth daily.   05/31/2023  amLODIPine  (NORVASC ) 10 mg tablet Take 1 tablet (10 mg total) by mouth daily. (Patient not taking: Reported on 06/01/2023) 90 tablet 3 Not Taking  metoprolol  tartrate (LOPRESSOR ) 25 mg Immediate Release tablet TOME UNA TABLETA DOS VECES AL DIA (Patient not taking: Reported on 06/01/2023) 180 tablet 1 Not Taking  predniSONE  (DELTASONE ) 20 mg tablet Take 1 tablet (20 mg total) by mouth 2 (two) times daily. Take with food. (Patient not taking: Reported on 06/01/2023) 10 tablet 0 Not Taking  Allergies No Known Allergies Inpatient Medications:Current Facility-Administered Medications Medication  diazePAM  (VALIUM ) tablet 10 mg  diazePAM  (VALIUM ) tablet 10 mg  folic acid  (FOLVITE ) tablet 1 mg  losartan  (COZAAR ) tablet 100 mg  melatonin tablet 3 mg  metoprolol  tartrate (LOPRESSOR ) Immediate Release tablet 25 mg  morphine  syringe 2 mg  nicotine  (NICODERM CQ ) transdermal patch 24 hr 7 mg  NIFEdipine  XL (PROCARDIA -XL) 24 hr tablet 60 mg  sodium chloride  0.9 % flush 3 mL  sodium chloride  0.9 % flush 3 mL  thiamine  (VITAMIN B1) tablet 100 mg Procedures:None Physical Exam:Appearance:   Alert and oriented,  Skin  Anicteric, no rashes.  HEENT  Normocephalic, mucous membranes moist Neck  No nodes, thyromegaly, JVD, or carotid bruits. Lungs  Clear to auscultation, breath sounds equal bilaterally. Heart  Rhythm regular. Normal S1 and S2. No murmurs,  Abdomen  +BS, soft, non tender. No rebound or guarding. No HSM, No masses, No ascites Extretmities  No cyanosis or edema..  Neuro   Alert and cooperative. Neuro exam grossly intact Labs:Recent Labs Lab 05/27/252041 05/28/250542 05/29/250535 NA 138 139 138 K 3.9 4.0 4.5 CL 101 104 103 CO2 22 24 23  BUN 17 14 14  CREATININE 1.19 1.01 0.92 CALCIUM 9.5 9.3 9.2 PROT 7.9  --   --  BILITOT 0.4  --   --  ALKPHOS 106  --   --  ALT 56  --   --  AST 32  --   --  GLU 106* 108* 113* WBC 14.5* 11.7* 11.1* HGB 15.2 15.1 14.3 HCT 44.20 43.80 41.90 PLT 258 250 245 MCV 89.3 89.6 91.5 Diagnostics:No new radiology.Assessment: Assessment:  Clifford Valdez is a 48 y.o. male hypertension distant history of pancreatitis presents with the acute onset abdominal pain. He is currently almost pain-free. Has been drinking an excessive amount of beer the day prior to admission. Abdominal examination at this time is benign. Right upper quadrant ultrasound shows normal gallbladder, hepatic steatosis and visualized pancreas unremarkable. Lipase was minimally elevated at 88 Plan: Okay to discharge home from GI standpoint on low-fat diet.He knows to abstain from alcoholWill follow up in the office in a few weeksSigned:Inara Dike Rockie Churchman, MD Office: [phone]

## 2023-06-02 NOTE — Plan of Care
 Plan of Care Overview/ Patient StatusPer MD, patient is medically ready for discharge.No CM needs identifiedMD follow up as an outpatientCase Management Plan  Flowsheet Row Most Recent Value Discharge Planning  Patient/Patient Representative goals/treatment preferences for discharge are:  None Patient/Patient Representative was presented with a list of facilities, agencies and/or dme providers and Referral(s) placed for: None Mode of Transportation  Private car  (add comment for special considerations) Patient accompanied by Self Discharge Readiness  PASRR completed and approved N/A Authorization number obtained, if required N/A Is there a 3 day INPATIENT Qualifying stay for Medicare Patients? N/A Medicare IM- signed, dated, timed and scanned, if required N/A DME Authorized/Delivered N/A No needs identified/ follow up with PCP/MD/Outpatient Provider Yes Post acute care services secured W10 complete N/A PRI Completed and Accepted ([facility]  Patients Only) N/A Is the destination address correct on the W10 Yes Last Bowel Movement 06/02/23 Finalized Plan  Expected Discharge Date 06/02/23 Discharge Disposition Home or Self Care  Clifford Graven MSN, RNCare CoordinationBridgeport HospitalPCU/[place] [phone] [phone]

## 2023-06-02 NOTE — Discharge Instructions
 Seek help if your tummy pain gets worse or you start throwing up. Avoid alcoholic drinks.

## 2023-06-02 NOTE — Discharge Summary
 [facility] SummaryPatient Data:  Patient Name: Clifford Valdez Admit date: 05/31/2023 Age: 48 y.o. Discharge date: 06/02/2023 DOB: [birth year]	 Discharge Attending Physician: Elias Gros, MD  MRN: [id number]	 Discharged Condition: good PCP: Finley Hugh, MD  Disposition: Home  Principal Diagnosis: Acute pancreatitisAlcohol use disorderComorbidities Comorbidities present on admission:Immunosuppressed due to prescription of immunosuppressive medication prior to admission.  Secondary diagnoses occurring during hospitalization:  Post Discharge Follow Up Items: Issues to be Addressed Post Discharge:Follow up with GI in few weeksMedication changes on discharge (Full medication list at conclusion of this summary) :Current Discharge Medication List  Discontinued   amLODIPine  (NORVASC ) 10 mg tablet    metoprolol  tartrate (LOPRESSOR ) 25 mg Immediate Release tablet    predniSONE  (DELTASONE ) 20 mg tablet    New  Details thiamine (VITAMIN B1) 100 mg tablet Take 1 tablet (100 mg total) by mouth daily.Start date: 06/03/2023, End date: 07/03/2023   Pending Labs and Tests: Follow-up Information:Stewart, Gita Lamb MD[address]St[address] [place] [phone] upAs needed No future appointments.Hospital Course: 48 year old male with past medical history of hypertension and a history of pancreatitis 5 years ago admitted for acute epigastric pain which started yesterday.  Pain radiating to back, not related to eating or bowel movements.  Associated with nausea but no vomiting.  Has not had a bowel movement since yesterday. No history of melena or hematochezia. He drinks about 13-14 beers on weekends and has been drinking over the holiday weekend. No fever, chills or night sweats.Right upper quadrant ultrasound shows normal gallbladder, hepatic steatosis and visualized pancreas is unremarkable.  Lipase was 88.  Patient was made NPO, GI was consulted and symptoms were monitored.  Started on thiamine and folic acid and monitor for alcohol withdrawal.  There were no signs of alcohol withdrawal and his symptoms improved.  He was started on a liquid diet and and it was slowly advanced.  He continued to have some mild discomfort, but otherwise was symptom free and tolerated the diet well.  He was counseled to abstain from alcohol to which expressed understanding.  Discharged home with recommendation on low-fat diet and to follow up with GI, Dr.Scott Sibyl Drafts in a few weeks.Inpatient Consultants and summary of recommendations:Pertinent Procedures or Surgeries: Data: Pertinent lab findings:Recent Labs Lab 05/27/252041 05/28/250542 05/29/250535 WBC 14.5* 11.7* 11.1* HGB 15.2 15.1 14.3 HCT 44.20 43.80 41.90 PLT 258 250 245  Recent Labs Lab 05/27/252041 05/28/250542 05/29/250535 NEUTROPHILS 70.5 68.2 72.2*  Recent Labs Lab 05/27/252041 05/28/250542 05/29/250535 NA 138 139 138 K 3.9 4.0 4.5 CL 101 104 103 CO2 22 24 23  BUN 17 14 14  CREATININE 1.19 1.01 0.92 GLU 106* 108* 113* ANIONGAP 15 11 12   Recent Labs Lab 05/27/252041 05/28/250542 05/29/250535 CALCIUM 9.5 9.3 9.2  Recent Labs Lab 05/27/252041 ALT 56 AST 32 ALKPHOS 106 BILITOT 0.4 BILIDIR 0.1  No results for input(s): PTT, LABPROT, INR in the last 168 hours. Microbiology:No results for input(s): LABBLOO, LABURIN, LOWERRESPIRA in the last 168 hours.Imaging: Imaging results last 1 week:  [facility] Renal Stone wo IV Contrast No OralResult Date: 5/28/2025No hydronephrosis or calculi Mild fat stranding surrounding the uncinate process of pancreas and the third part of duodenum, unclear if this is from duodenitis versus pancreatitis. Correlate clinically. [facility] Notify System Classification: Routine. Reported and signed by: Licia Reek, MD US  Right Upper QuadrantResult Date: 05/31/2023 No evidence of cholelithiasis or acute cholecystitis. Echogenic liver, which may be seen with hepatic steatosis or other hepatic parenchymal diseases. [facility] Notify System Classification: Routine. Reported and signed by: Belvie Boyers, MD  Diet:  Diet Fat RestrictedMobility: Highest Level of mobility - ACTUAL: Mobility Level 7, Walk 25+ feet, AM PAC 22-23  Physical Exam Discharge vital signs: Vitals:  06/02/23 0847 BP: (!) 189/103 Pulse: 71 Resp: 18 Temp: 97.6 ?F (36.4 ?C) Cognitive Status at Discharge: Alert and Oriented x 3Physical ExamGeneral: Alert, NAD, Oriented X 3, Fluent speechNeck: Supple, No JVD, No bruitCVS: RRR, S1/S2Chest: CTA Bilaterally, No rhonchiAbdomen: Soft, obese, some tenderness without guarding in the epigastric area, BS normoactiveSkin/Extremities: No rash, No LE edemaNeuro: No focal deficitsHistory  Allergies Allergies  No Known Allergies   PMH PSH Past Medical History: Diagnosis Date  Hypertension   No past surgical history on file. Social History Family History Social History Tobacco Use  Smoking status: Every Day   Current packs/day: 0.50   Types: Cigarettes  Smokeless tobacco: Never Substance Use Topics  Alcohol use: Yes   Comment: occassional  No family history on file.   Discharge Medications  Discharge: Current Discharge Medication List  START taking these medications  Details thiamine (VITAMIN B1) 100 mg tablet Take 1 tablet (100 mg total) by mouth daily.Qty: 30 tablet, Refills: 0Start date: 06/03/2023, End date: 07/03/2023   CONTINUE these medications which have NOT CHANGED  Details losartan  (COZAAR ) 100 mg tablet Take 1 tablet (100 mg total) by mouth daily.Qty: 30 tablet, Refills: 1  NIFEdipine XL (PROCARDIA-XL) 60 mg 24 hr tablet Take 1 tablet (60 mg total) by mouth daily.   STOP taking these medications   amLODIPine  (NORVASC ) 10 mg tablet    metoprolol  tartrate (LOPRESSOR ) 25 mg Immediate Release tablet    predniSONE  (DELTASONE ) 20 mg tablet      Electronically Signed:Beulah Matusek, MD 06/02/2023 10:09 AMBest Contact Information: MHB

## 2023-06-02 NOTE — Plan of Care
 Plan of Care Overview/ Patient StatusProblem: Adult Inpatient Plan of CareGoal: Readiness for Transition of CareOutcome: Outcome(s) achieved Case Management Plan  Flowsheet Row Most Recent Value Discharge Planning  Patient/Patient Representative goals/treatment preferences for discharge are:  None Patient/Patient Representative was presented with a list of facilities, agencies and/or dme providers and Referral(s) placed for: None Mode of Transportation  Private car  (add comment for special considerations) Patient accompanied by Self Discharge Readiness  PASRR completed and approved N/A Authorization number obtained, if required N/A Is there a 3 day INPATIENT Qualifying stay for Medicare Patients? N/A Medicare IM- signed, dated, timed and scanned, if required N/A DME Authorized/Delivered N/A No needs identified/ follow up with PCP/MD/Outpatient Provider Yes Post acute care services secured W10 complete N/A PRI Completed and Accepted ([place]  Patients Only) N/A Is the destination address correct on the W10 Yes Last Bowel Movement 06/02/23 Finalized Plan  Expected Discharge Date 06/02/23 Discharge Disposition Home or Self Care  Ginni Eichler, LMSWSocial work case Production designer, theatre/television/film [phone]

## 2023-06-02 NOTE — Plan of Care
 Clifford Valdez was discharged via Private Car accompanied by family member to home.  Verbalized understanding of discharge instructionsand recommended follow up care as per the after visit summary.  Written discharge instructions provided. Denies any further questions. Vital signs    Vitals:  06/01/23 1423 06/01/23 1959 06/02/23 0532 06/02/23 0847 BP: (!) 148/81 (!) 157/79 (!) 161/98 (!) 189/103 Pulse: 74 75 70 71 Resp: 20 20 20 18  Temp: 97 ?F (36.1 ?C) 98.1 ?F (36.7 ?C) 97.9 ?F (36.6 ?C) 97.6 ?F (36.4 ?C) TempSrc: Oral Oral Oral Oral SpO2: 98% 99% 99% 97% Weight:     Height:     Patient confirmed all belongings returned. Belongings charted in last 7 days: Patient Valuables   Patient Valuables Flowsheet                    PATIENT VALUABLE(S)       Cell phone disposition At bedside/locker/closet 06/01/23 0340  Clothing Disposition At bedside/locker/closet 06/01/23 0340   Wallet Disposition At bedside/locker/closet 06/01/23 0340

## 2023-06-03 ENCOUNTER — Encounter: Admit: 2023-06-03 | Payer: PRIVATE HEALTH INSURANCE | Primary: Internal Medicine
